# Patient Record
Sex: Female | Born: 1985 | Hispanic: Yes | Smoking: Never smoker
Health system: Southern US, Community
[De-identification: ages and names within clinical notes are randomized; demographics above are authoritative.]

## PROBLEM LIST (undated history)

## (undated) DIAGNOSIS — O139 Gestational [pregnancy-induced] hypertension without significant proteinuria, unspecified trimester: Secondary | ICD-10-CM

## (undated) DIAGNOSIS — O1490 Unspecified pre-eclampsia, unspecified trimester: Secondary | ICD-10-CM

## (undated) DIAGNOSIS — O24419 Gestational diabetes mellitus in pregnancy, unspecified control: Secondary | ICD-10-CM

## (undated) HISTORY — DX: Gestational (pregnancy-induced) hypertension without significant proteinuria, unspecified trimester: O13.9

## (undated) HISTORY — DX: Unspecified pre-eclampsia, unspecified trimester: O14.90

---
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
## 2020-04-02 LAB — OB RESULTS CONSOLE RPR: RPR: NONREACTIVE

## 2020-04-02 LAB — OB RESULTS CONSOLE HGB/HCT, BLOOD
HCT: 36 (ref 29–41)
Hemoglobin: 12.5

## 2020-04-02 LAB — HEPATITIS C ANTIBODY: HCV Ab: NEGATIVE

## 2020-04-02 LAB — OB RESULTS CONSOLE GC/CHLAMYDIA
Chlamydia: NEGATIVE
Gonorrhea: NEGATIVE

## 2020-04-02 LAB — OB RESULTS CONSOLE RUBELLA ANTIBODY, IGM: Rubella: IMMUNE

## 2020-04-02 LAB — OB RESULTS CONSOLE VARICELLA ZOSTER ANTIBODY, IGG: Varicella: IMMUNE

## 2020-04-02 LAB — SICKLE CELL SCREEN: Sickle Cell Screen: NORMAL

## 2020-04-02 LAB — OB RESULTS CONSOLE ANTIBODY SCREEN: Antibody Screen: NEGATIVE

## 2020-04-02 LAB — GLUCOSE TOLERANCE, 1 HOUR: Glucose, 1 Hour GTT: 114

## 2020-04-02 LAB — OB RESULTS CONSOLE ABO/RH: RH Type: POSITIVE

## 2020-04-02 LAB — OB RESULTS CONSOLE HIV ANTIBODY (ROUTINE TESTING): HIV: NONREACTIVE

## 2020-04-02 LAB — OB RESULTS CONSOLE PLATELET COUNT: Platelets: 374

## 2020-04-02 LAB — CYTOLOGY - PAP: Pap: NEGATIVE

## 2020-04-04 ENCOUNTER — Other Ambulatory Visit: Payer: Self-pay

## 2020-04-11 ENCOUNTER — Ambulatory Visit: Payer: Self-pay | Attending: Family Medicine

## 2020-04-25 ENCOUNTER — Encounter: Payer: Self-pay | Admitting: General Practice

## 2020-04-30 ENCOUNTER — Encounter: Payer: Self-pay | Admitting: Family Medicine

## 2020-05-08 ENCOUNTER — Encounter: Payer: Self-pay | Admitting: Family Medicine

## 2020-05-08 ENCOUNTER — Ambulatory Visit (INDEPENDENT_AMBULATORY_CARE_PROVIDER_SITE_OTHER): Payer: Self-pay | Admitting: Family Medicine

## 2020-05-08 ENCOUNTER — Other Ambulatory Visit: Payer: Self-pay

## 2020-05-08 VITALS — BP 104/69 | HR 90 | Wt 177.7 lb

## 2020-05-08 DIAGNOSIS — Z98891 History of uterine scar from previous surgery: Secondary | ICD-10-CM

## 2020-05-08 DIAGNOSIS — O09299 Supervision of pregnancy with other poor reproductive or obstetric history, unspecified trimester: Secondary | ICD-10-CM | POA: Insufficient documentation

## 2020-05-08 DIAGNOSIS — O0990 Supervision of high risk pregnancy, unspecified, unspecified trimester: Secondary | ICD-10-CM | POA: Insufficient documentation

## 2020-05-08 DIAGNOSIS — O280 Abnormal hematological finding on antenatal screening of mother: Secondary | ICD-10-CM | POA: Insufficient documentation

## 2020-05-08 DIAGNOSIS — O09899 Supervision of other high risk pregnancies, unspecified trimester: Secondary | ICD-10-CM | POA: Insufficient documentation

## 2020-05-08 LAB — COMPREHENSIVE METABOLIC PANEL
ALT: 18 IU/L (ref 0–32)
AST: 18 IU/L (ref 0–40)
Albumin/Globulin Ratio: 1.3 (ref 1.2–2.2)
Albumin: 3.9 g/dL (ref 3.8–4.8)
Alkaline Phosphatase: 81 IU/L (ref 44–121)
BUN/Creatinine Ratio: 13 (ref 9–23)
BUN: 7 mg/dL (ref 6–20)
Bilirubin Total: 0.3 mg/dL (ref 0.0–1.2)
CO2: 20 mmol/L (ref 20–29)
Calcium: 9.7 mg/dL (ref 8.7–10.2)
Chloride: 103 mmol/L (ref 96–106)
Creatinine, Ser: 0.55 mg/dL — ABNORMAL LOW (ref 0.57–1.00)
GFR calc Af Amer: 141 mL/min/{1.73_m2} (ref >59)
GFR calc non Af Amer: 122 mL/min/{1.73_m2} (ref >59)
Globulin, Total: 3 g/dL (ref 1.5–4.5)
Glucose: 111 mg/dL — ABNORMAL HIGH (ref 65–99)
Potassium: 4.2 mmol/L (ref 3.5–5.2)
Sodium: 138 mmol/L (ref 134–144)
Total Protein: 6.9 g/dL (ref 6.0–8.5)

## 2020-05-08 NOTE — Patient Instructions (Signed)
 Eleccin del mtodo anticonceptivo Contraception Choices La anticoncepcin, o los mtodos anticonceptivos, hace referencia a los mtodos o dispositivos que evitan el embarazo. Mtodos hormonales Implante anticonceptivo Un implante anticonceptivo consiste en un tubo delgado de plstico que contiene una hormona que evita el embarazo. Es diferente de un dispositivo intrauterino (DIU). Un mdico lo inserta en la parte superior del brazo. Los implantes pueden ser eficaces durante un mximo de 3 aos. Inyecciones de progestina sola Las inyecciones de progestina sola contienen progestina, una forma sinttica de la hormona progesterona. Un mdico las administra cada 3 meses. Pldoras anticonceptivas Las pldoras anticonceptivas son pastillas que contienen hormonas que evitan el embarazo. Deben tomarse una vez al da, preferentemente a la misma hora cada da. Se necesita una receta para utilizar este mtodo anticonceptivo. Parche anticonceptivo El parche anticonceptivo contiene hormonas que evitan el embarazo. Se coloca en la piel, debe cambiarse una vez a la semana durante tres semanas y debe retirarse en la cuarta semana. Se necesita una receta para utilizar este mtodo anticonceptivo. Anillo vaginal Un anillo vaginal contiene hormonas que evitan el embarazo. Se coloca en la vagina durante tres semanas y se retira en la cuarta semana. Luego se repite el proceso con un anillo nuevo. Se necesita una receta para utilizar este mtodo anticonceptivo. Anticonceptivo de emergencia Los anticonceptivos de emergencia son mtodos para evitar un embarazo despus de tener sexo sin proteccin. Vienen en forma de pldora y pueden tomarse hasta 5 das despus de tener sexo. Funcionan mejor cuando se toman lo ms pronto posible luego de tener sexo. La mayora de los anticonceptivos de emergencia estn disponibles sin receta mdica. Este mtodo no debe utilizarse como el nico mtodo anticonceptivo.   Mtodos de  barrera Condn masculino Un condn masculino es una vaina delgada que se coloca sobre el pene durante el sexo. Los condones evitan que el esperma ingrese en el cuerpo de la mujer. Pueden utilizarse con un una sustancia que mata a los espermatozoides (espermicida) para aumentar la efectividad. Deben desecharse despus de un uso. Condn femenino Un condn femenino es una vaina blanda y holgada que se coloca en la vagina antes de tener sexo. El condn evita que el esperma ingrese en el cuerpo de la mujer. Deben desecharse despus de un uso. Diafragma Un diafragma es una barrera blanda con forma de cpula. Se inserta en la vagina antes del sexo, junto con un espermicida. El diafragma bloquea el ingreso de esperma en el tero, y el espermicida mata a los espermatozoides. El diafragma debe permanecer en la vagina durante 6 a 8 horas despus de tener sexo y debe retirarse en el plazo de las 24 horas. Un diafragma es recetado y colocado por un mdico. Debe reemplazarse cada 1 a 2 aos, despus de dar a luz, de aumentar ms de 15lb (6.8kg) y de una ciruga plvica. Capuchn cervical Un capuchn cervical es una copa redonda y blanda de ltex o plstico que se coloca en el cuello uterino. Se inserta en la vagina antes del sexo, junto con un espermicida. Bloquea el ingreso del esperma en el tero. El capuchn debe permanecer en el lugar durante 6 a 8 horas despus de tener sexo y debe retirarse en el plazo de las 48 horas. Un capuchn cervical debe ser recetado y colocado por un mdico. Debe reemplazarse cada 2aos. Esponja Una esponja es una pieza blanda y circular de espuma de poliuretano que contiene espermicida. La esponja ayuda a bloquear el ingreso de esperma en el tero, y el   espermicida mata a los espermatozoides. Para utilizarla, debe humedecerla e insertarla en la vagina. Debe insertarse antes de tener sexo, debe permanecer dentro al menos durante 6 horas despus de tener sexo y debe retirarse y  desecharse en el plazo de las 30 horas. Espermicidas Los espermicidas son sustancias qumicas que matan o bloquean al esperma y no lo dejan ingresar al cuello uterino y al tero. Vienen en forma de crema, gel, supositorio, espuma o comprimido. Un espermicida debe insertarse en la vagina con un aplicador al menos 10 o 15 minutos antes de tener sexo para dar tiempo a que surta efecto. El proceso debe repetirse cada vez que tenga sexo. Los espermicidas no requieren receta mdica.   Anticonceptivos intrauterinos Dispositivo intrauterino (DIU) Un DIU es un dispositivo en forma de T que se coloca en el tero. Existen dos tipos:  DIU hormonal.Este tipo contiene progestina, una forma sinttica de la hormona progesterona. Este tipo puede permanecer colocado durante 3 a 5 aos.  DIU de cobre.Este tipo est recubierto con un alambre de cobre. Puede permanecer colocado durante 10 aos. Mtodos anticonceptivos permanentes Ligadura de trompas en la mujer En este mtodo, se sellan, atan u obstruyen las trompas de Falopio durante una ciruga para evitar que el vulo descienda hacia el tero. Esterilizacin histeroscpica En este mtodo, se coloca un implante pequeo y flexible dentro de cada trompa de Falopio. Los implantes hacen que se forme un tejido cicatricial en las trompas de Falopio y que las obstruya para que el espermatozoide no pueda llegar al vulo. El procedimiento demora alrededor de 3 meses para que sea efectivo. Debe utilizarse otro mtodo anticonceptivo durante esos 3 meses. Esterilizacin masculina Este es un procedimiento que consiste en atar los conductos que transportan el esperma (vasectoma). Luego del procedimiento, el hombre puede eyacular lquido (semen). Debe utilizarse otro mtodo anticonceptivo durante 3 meses despus del procedimiento. Mtodos de planificacin natural Planificacin familiar natural En este mtodo, la pareja no tiene sexo durante los das en que la mujer podra quedar  embarazada. Mtodo calendario En este mtodo, la mujer realiza un seguimiento de la duracin de cada ciclo menstrual, identifica los das en los que se puede producir un embarazo y no tiene sexo durante esos das. Mtodo de la ovulacin En este mtodo, la pareja evita tener sexo durante la ovulacin. Mtodo sintotrmico Este mtodo implica no tener sexo durante la ovulacin. Normalmente, la mujer comprueba la ovulacin al observar cambios en su temperatura y en la consistencia del moco cervical. Mtodo posovulacin En este mtodo, la pareja espera a que finalice la ovulacin para tener sexo. Dnde buscar ms informacin  Centers for Disease Control and Prevention (Centros para el Control y la Prevencin de Enfermedades): www.cdc.gov Resumen  La anticoncepcin, o los mtodos anticonceptivos, hace referencia a los mtodos o dispositivos que evitan el embarazo.  Los mtodos anticonceptivos hormonales incluyen implantes, inyecciones, pastillas, parches, anillos vaginales y anticonceptivos de emergencia.  Los mtodos anticonceptivos de barrera pueden incluir condones masculinos, condones femeninos, diafragmas, capuchones cervicales, esponjas y espermicidas.  Existen dos tipos de DIU (dispositivo intrauterino). Un DIU puede colocarse en el tero de una mujer para evitar el embarazo durante 3 a 5 aos.  La esterilizacin permanente puede realizarse mediante un procedimiento tanto en los hombres como en las mujeres. Los mtodos de planificacin familiar natural implican no tener sexo durante los das en que la mujer podra quedar embarazada. Esta informacin no tiene como fin reemplazar el consejo del mdico. Asegrese de hacerle al mdico cualquier pregunta que   tenga. Document Revised: 11/01/2019 Document Reviewed: 11/01/2019 Elsevier Patient Education  2021 [employer] Inc.   Lactancia materna Breastfeeding  Decidir amamantar es una de las mejores elecciones que puede hacer por usted y su  beb. Un cambio en las hormonas durante el embarazo hace que las mamas produzcan leche materna en las glndulas productoras de leche. Las hormonas impiden que la leche materna sea liberada antes del nacimiento del beb. Adems, impulsan el flujo de leche luego del nacimiento. Una vez que ha comenzado a amamantar, pensar en el beb, as como la succin o el llanto, pueden estimular la liberacin de leche de las glndulas productoras de leche. Los beneficios de amamantar Las investigaciones demuestran que la lactancia materna ofrece muchos beneficios de salud para bebs y madres. Adems, ofrece una forma gratuita y conveniente de alimentar al beb. Para el beb  La primera leche (calostro) ayuda a mejorar el funcionamiento del aparato digestivo del beb.  Las clulas especiales de la leche (anticuerpos) ayudan a combatir las infecciones en el beb.  Los bebs que se alimentan con leche materna tambin tienen menos probabilidades de tener asma, alergias, obesidad o diabetes de tipo 2. Adems, tienen menor riesgo de sufrir el sndrome de muerte sbita del lactante (SMSL).  Los nutrientes de la leche materna son mejores para satisfacer las necesidades del beb en comparacin con la leche maternizada.  La leche materna mejora el desarrollo cerebral del beb. Para usted  La lactancia materna favorece el desarrollo de un vnculo muy especial entre la madre y el beb.  Es conveniente. La leche materna es econmica y siempre est disponible a la temperatura correcta.  La lactancia materna ayuda a quemar caloras. Le ayuda a perder el peso ganado durante el embarazo.  Hace que el tero vuelva al tamao que tena antes del embarazo ms rpido. Adems, disminuye el sangrado (loquios) despus del parto.  La lactancia materna contribuye a reducir el riesgo de tener diabetes de tipo 2, osteoporosis, artritis reumatoide, enfermedades cardiovasculares y cncer de mama, ovario, tero y endometrio en el  futuro. Informacin bsica sobre la lactancia Comienzo de la lactancia  Encuentre un lugar cmodo para sentarse o acostarse, con un buen respaldo para el cuello y la espalda.  Coloque una almohada o una manta enrollada debajo del beb para acomodarlo a la altura de la mama (si est sentada). Las almohadas para amamantar se han diseado especialmente a fin de servir de apoyo para los brazos y el beb mientras amamanta.  Asegrese de que la barriga del beb (abdomen) est frente a la suya.  Masajee suavemente la mama. Con las yemas de los dedos, masajee los bordes exteriores de la mama hacia adentro, en direccin al pezn. Esto estimula el flujo de leche. Si la leche fluye lentamente, es posible que deba continuar con este movimiento durante la lactancia.  Sostenga la mama con 4 dedos por debajo y el pulgar por arriba del pezn (forme la letra "C" con la mano). Asegrese de que los dedos se encuentren lejos del pezn y de la boca del beb.  Empuje suavemente los labios del beb con el pezn o con el dedo.  Cuando la boca del beb se abra lo suficiente, acrquelo rpidamente a la mama e introduzca todo el pezn y la arola, tanto como sea posible, dentro de la boca del beb. La arola es la zona de color que rodea al pezn. ? Debe haber ms arola visible por arriba del labio superior del beb que por debajo del labio   inferior. ? Los labios del beb deben estar abiertos y extendidos hacia afuera (evertidos) para asegurar que el beb se prenda de forma adecuada y cmoda. ? La lengua del beb debe estar entre la enca inferior y la mama.  Asegrese de que la boca del beb est en la posicin correcta alrededor del pezn (prendido). Los labios del beb deben crear un sello sobre la mama y estar doblados hacia afuera (invertidos).  Es comn que el beb succione durante 2 a 3 minutos para que comience el flujo de leche materna. Cmo debe prenderse Es muy importante que le ensee al beb cmo  prenderse adecuadamente a la mama. Si el beb no se prende adecuadamente, puede causar dolor en los pezones, reducir la produccin de leche materna y hacer que el beb tenga un escaso aumento de peso. Adems, si el beb no se prende adecuadamente al pezn, puede tragar aire durante la alimentacin. Esto puede causarle molestias al beb. Hacer eructar al beb al cambiar de mama puede ayudarlo a liberar el aire. Sin embargo, ensearle al beb cmo prenderse a la mama adecuadamente es la mejor manera de evitar que se sienta molesto por tragar aire mientras se alimenta. Signos de que el beb se ha prendido adecuadamente al pezn  Tironea o succiona de modo silencioso, sin causarle dolor. Los labios del beb deben estar extendidos hacia afuera (evertidos).  Se escucha que traga cada 3 o 4 succiones una vez que la leche ha comenzado a fluir (despus de que se produzca el reflejo de eyeccin de la leche).  Hay movimientos musculares por arriba y por delante de sus odos al succionar. Signos de que el beb no se ha prendido adecuadamente al pezn  Hace ruidos de succin o de chasquido mientras se alimenta.  Siente dolor en los pezones. Si cree que el beb no se prendi correctamente, deslice el dedo en la comisura de la boca y colquelo entre las encas del beb para interrumpir la succin. Intente volver a comenzar a amamantar. Signos de lactancia materna exitosa Signos del beb  El beb disminuir gradualmente el nmero de succiones o dejar de succionar por completo.  El beb se quedar dormido.  El cuerpo del beb se relajar.  El beb retendr una pequea cantidad de leche en la boca.  El beb se desprender solo del pecho. Signos que presenta usted  Las mamas han aumentado la firmeza, el peso y el tamao 1 a 3 horas despus de amamantar.  Estn ms blandas inmediatamente despus de amamantar.  Se producen un aumento del volumen de leche y un cambio en su consistencia y color hacia el  quinto da de lactancia.  Los pezones no duelen, no estn agrietados ni sangran. Signos de que su beb recibe la cantidad de leche suficiente  Mojar por lo menos 1 o 2paales durante las primeras 24horas despus del nacimiento.  Mojar por lo menos 5 o 6paales cada 24horas durante la primera semana despus del nacimiento. La orina debe ser clara o de color amarillo plido a los 5das de vida.  Mojar entre 6 y 8paales cada 24horas a medida que el beb sigue creciendo y desarrollndose.  Defeca por lo menos 3 veces en 24 horas a los 5 das de vida. Las heces deben ser blandas y amarillentas.  Defeca por lo menos 3 veces en 24 horas a los 7 das de vida. Las heces deben ser grumosas y amarillentas.  No registra una prdida de peso mayor al 10% del peso al   nacer durante los primeros 3 das de vida.  Aumenta de peso un promedio de 4 a 7onzas (113 a 198g) por semana despus de los 4 das de vida.  Aumenta de peso, diariamente, de manera uniforme a partir de los 5 das de vida, sin registrar prdida de peso despus de las 2semanas de vida. Despus de alimentarse, es posible que el beb regurgite una pequea cantidad de leche. Esto es normal. Frecuencia y duracin de la lactancia El amamantamiento frecuente la ayudar a producir ms leche y puede prevenir dolores en los pezones y las mamas extremadamente llenas (congestin mamaria). Alimente al beb cuando muestre signos de hambre o si siente la necesidad de reducir la congestin de las mamas. Esto se denomina "lactancia a demanda". Las seales de que el beb tiene hambre incluyen las siguientes:  Aumento del estado de alerta, actividad o inquietud.  Mueve la cabeza de un lado a otro.  Abre la boca cuando se le toca la mejilla o la comisura de la boca (reflejo de bsqueda).  Aumenta las vocalizaciones, tales como sonidos de succin, se relame los labios, emite arrullos, suspiros o chirridos.  Mueve la mano hacia la boca y se chupa  los dedos o las manos.  Est molesto o llora. Evite el uso del chupete en las primeras 4 a 6 semanas despus del nacimiento del beb. Despus de este perodo, podr usar un chupete. Las investigaciones demostraron que el uso del chupete durante el primer ao de vida del beb disminuye el riesgo de tener el sndrome de muerte sbita del lactante (SMSL). Permita que el nio se alimente en cada mama todo lo que desee. Cuando el beb se desprende o se queda dormido mientras se est alimentando de la primera mama, ofrzcale la segunda. Debido a que, con frecuencia, los recin nacidos estn somnolientos las primeras semanas de vida, es posible que deba despertar al beb para alimentarlo. Los horarios de lactancia varan de un beb a otro. Sin embargo, las siguientes reglas pueden servir como gua para ayudarla a garantizar que el beb se alimenta adecuadamente:  Se puede amamantar a los recin nacidos (bebs de 4 semanas o menos de vida) cada 1 a 3 horas.  No deben transcurrir ms de 3 horas durante el da o 5 horas durante la noche sin que se amamante a los recin nacidos.  Debe amamantar al beb un mnimo de 8 veces en un perodo de 24 horas. Extraccin de leche materna La extraccin y el almacenamiento de la leche materna le permiten asegurarse de que el beb se alimente exclusivamente de su leche materna, aun en momentos en los que no puede amamantar. Esto tiene especial importancia si debe regresar al trabajo en el perodo en que an est amamantando o si no puede estar presente en los momentos en que el beb debe alimentarse. Su asesor en lactancia puede ayudarla a encontrar un mtodo de extraccin que funcione mejor para usted y orientarla sobre cunto tiempo es seguro almacenar leche materna.      Cmo cuidar las mamas durante la lactancia Los pezones pueden secarse, agrietarse y doler durante la lactancia. Las siguientes recomendaciones pueden ayudarla a mantener las mamas humectadas y  sanas:  Evite usar jabn en los pezones.  Use un sostn de soporte diseado especialmente para la lactancia materna. Evite usar sostenes con aro o sostenes muy ajustados (sostenes deportivos).  Seque al aire sus pezones durante 3 a 4minutos despus de amamantar al beb.  Utilice solo apsitos de algodn en   el sostn para absorber las prdidas de leche. La prdida de un poco de leche materna entre las tomas es normal.  Utilice lanolina sobre los pezones luego de amamantar. La lanolina ayuda a mantener la humedad normal de la piel. La lanolina pura no es perjudicial (no es txica) para el beb. Adems, puede extraer manualmente algunas gotas de leche materna y masajear suavemente esa leche sobre los pezones para que la leche se seque al aire. Durante las primeras semanas despus del nacimiento, algunas mujeres experimentan congestin mamaria. La congestin mamaria puede hacer que sienta las mamas pesadas, calientes y sensibles al tacto. El pico de la congestin mamaria ocurre en el plazo de los 3 a 5 das despus del parto. Las siguientes recomendaciones pueden ayudarla a aliviar la congestin mamaria:  Vace por completo las mamas al amamantar o extraer leche. Puede aplicar calor hmedo en las mamas (en la ducha o con toallas hmedas para manos) antes de amamantar o extraer leche. Esto aumenta la circulacin y ayuda a que la leche fluya. Si el beb no vaca por completo las mamas cuando lo amamanta, extraiga la leche restante despus de que haya finalizado.  Aplique compresas de hielo sobre las mamas inmediatamente despus de amamantar o extraer leche, a menos que le resulte demasiado incmodo. Haga lo siguiente: ? Ponga el hielo en una bolsa plstica. ? Coloque una toalla entre la piel y la bolsa de hielo. ? Coloque el hielo durante 20minutos, 2 o 3veces por da.  Asegrese de que el beb est prendido y se encuentre en la posicin correcta mientras lo alimenta. Si la congestin mamaria  persiste luego de 48 horas o despus de seguir estas recomendaciones, comunquese con su mdico o un asesor en lactancia. Recomendaciones de salud general durante la lactancia  Consuma 3 comidas y 3 colaciones saludables todos los das. Las madres bien alimentadas que amamantan necesitan entre 450 y 500 caloras adicionales por da. Puede cumplir con este requisito al aumentar la cantidad de una dieta equilibrada que realice.  Beba suficiente agua para mantener la orina clara o de color amarillo plido.  Descanse con frecuencia, reljese y siga tomando sus vitaminas prenatales para prevenir la fatiga, el estrs y los niveles bajos de vitaminas y minerales en el cuerpo (deficiencias de nutrientes).  No consuma ningn producto que contenga nicotina o tabaco, como cigarrillos y cigarrillos electrnicos. El beb puede verse afectado por las sustancias qumicas de los cigarrillos que pasan a la leche materna y por la exposicin al humo ambiental del tabaco. Si necesita ayuda para dejar de fumar, consulte al mdico.  Evite el consumo de alcohol.  No consuma drogas ilegales o marihuana.  Antes de usar cualquier medicamento, hable con el mdico. Estos incluyen medicamentos recetados y de venta libre, como tambin vitaminas y suplementos a base de hierbas. Algunos medicamentos, que pueden ser perjudiciales para el beb, pueden pasar a travs de la leche materna.  Puede quedar embarazada durante la lactancia. Si se desea un mtodo anticonceptivo, consulte al mdico sobre cules son las opciones seguras durante la lactancia. Dnde encontrar ms informacin: Liga internacional La Leche: www.llli.org. Comunquese con un mdico si:  Siente que quiere dejar de amamantar o se siente frustrada con la lactancia.  Sus pezones estn agrietados o sangran.  Sus mamas estn irritadas, sensibles o calientes.  Tiene los siguientes sntomas: ? Dolor en las mamas o en los pezones. ? Un rea hinchada en cualquiera  de las mamas. ? Fiebre o escalofros. ? Nuseas o vmitos. ?   Drenaje de otro lquido distinto de la leche materna desde los pezones.  Sus mamas no se llenan antes de amamantar al beb para el quinto da despus del parto.  Se siente triste y deprimida.  El beb: ? Est demasiado somnoliento como para comer bien. ? Tiene problemas para dormir. ? Tiene ms de 1 semana de vida y moja menos de 6 paales en un periodo de 24 horas. ? No ha aumentado de peso a los 5 das de vida.  El beb defeca menos de 3 veces en 24 horas.  La piel del beb o las partes blancas de los ojos se vuelven amarillentas. Solicite ayuda de inmediato si:  El beb est muy cansado (letargo) y no se quiere despertar para comer.  Le sube la fiebre sin causa. Resumen  La lactancia materna ofrece muchos beneficios de salud para bebs y madres.  Intente amamantar a su beb cuando muestre signos tempranos de hambre.  Haga cosquillas o empuje suavemente los labios del beb con el dedo o el pezn para lograr que el beb abra la boca. Acerque el beb a la mama. Asegrese de que la mayor parte de la arola se encuentre dentro de la boca del beb. Ofrzcale una mama y haga eructar al beb antes de pasar a la otra.  Hable con su mdico o asesor en lactancia si tiene dudas o problemas con la lactancia. Esta informacin no tiene como fin reemplazar el consejo del mdico. Asegrese de hacerle al mdico cualquier pregunta que tenga. Document Revised: 06/25/2017 Document Reviewed: 07/21/2016 Elsevier Patient Education  2021 [employer] Inc.  

## 2020-05-08 NOTE — Progress Notes (Signed)
Subjective:   Kelli Robbins is a 35 y.o. [id number] at [redacted]w[redacted]d by LMP (reports she had regular period prior to conceiving) being seen today for her first obstetrical visit.  Her obstetrical history is significant for advanced maternal age, pre-eclampsia and preterm delivery. Patient does intend to breast feed. Pregnancy history fully reviewed.   Patient reports no complaints.  Patient reports first delivery at 28 weeks in setting of preterm labor. Unsure why they did a section, does not know if baby was breech. Second was repeat for history of prior section and also had pre-eclampsia, done at term. Does not know why she was diagnosed with PreE.   HISTORY: OB History  Gravida Para Term Preterm AB Living  3 2 1 1  0 2  SAB IAB Ectopic Multiple Live Births  0 0 0 0 2    # Outcome Date GA Lbr Len/2nd Weight Sex Delivery Anes PTL Lv  3 Current           2 Term 12/16/10 [redacted]w[redacted]d  8 lb (3.629 kg) F CS-LTranv   LIV  1 Preterm 07/30/01 [redacted]w[redacted]d  4 lb 8 oz (2.041 kg) M CS-LTranv   LIV     Complications: IUGR (intrauterine growth restriction) affecting care of mother   Last pap smear was  04/02/2020 and was normal with negative HRHPV Past Medical History:  Diagnosis Date  . Gestational hypertension   . Preeclampsia    Past Surgical History:  Procedure Laterality Date  . CESAREAN SECTION     Family History  Problem Relation Age of Onset  . Hypertension Mother   . Asthma Father      Not on File Current Outpatient Medications on File Prior to Visit  Medication Sig Dispense Refill  . ASPIRIN 81 PO Take by mouth.    . Prenatal Vit-Fe Fumarate-FA (PRENATAL VITAMIN PO) Take by mouth.     No current facility-administered medications on file prior to visit.     Exam   Vitals:   05/08/20 1015  BP: 104/69  Pulse: 90  Weight: 177 lb 11.2 oz (80.6 kg)   Fetal Heart Rate (bpm): 146  Uterus:     System: General: well-developed, well-nourished female in no acute distress   Skin: normal  coloration and turgor, no rashes   Neurologic: oriented, normal, negative, normal mood   Extremities: normal strength, tone, and muscle mass, ROM of all joints is normal   HEENT PERRLA, extraocular movement intact and sclera clear, anicteric   Mouth/Teeth mucous membranes moist, pharynx normal without lesions and dental hygiene good   Neck supple and no masses   Respiratory:  no respiratory distress     Assessment:   Pregnancy: 05/10/20 Patient Active Problem List   Diagnosis Date Noted  . Supervision of high risk pregnancy, antepartum 05/08/2020  . History of preterm delivery, currently pregnant 05/08/2020  . History of pre-eclampsia in prior pregnancy, currently pregnant 05/08/2020  . History of cesarean section 05/08/2020     Plan:  1. Supervision of high risk pregnancy, antepartum Transfer from [facility] due to hx of preterm delivery, pre-eclampsia, and two prior sections On review of prenatal records noted to have abnormal quad screen with elevated risk for Down Syndrome Dated by LMP currently, reports periods were regular prior to conceiving Patient very distraught after I discussed this information with her as she was unaware of these results.  Emphasized that this is a screening test and very dependent on her dates for interpretation, so until  we get back the results of the US done at [facility] five days ago we should view this result with caution If results remain abnormal/dates are confirmed discussed she would need additional testing and consultation Will follow up with her ASAP once results are available Remainder of prenatal labs reviewed and are normal  2. History of preterm delivery, currently pregnant Unclear circumstances, followed by term delivery per patient Some degree of iatrogenic element per history and already past [redacted] wks gestational age, did not discuss makena with patient  3. History of pre-eclampsia in prior pregnancy, currently pregnant No baseline CMP or  UPCR on file, will obtain today  4. History of cesarean section X2, first at 28 weeks and second at term Patient had low transverse skin incision for first cesarean Despite early gestational age likely has low transverse uterine scar and will plan on delivery at 39 weeks   Problem list reviewed and updated. The nature of [facility] with multiple MDs and other Advanced Practice Providers was explained to patient; also emphasized that residents, students are part of our team. Routine obstetric precautions reviewed. Return in 4 weeks (on 06/05/2020) for [facility], ob visit.

## 2020-05-09 LAB — PROTEIN / CREATININE RATIO, URINE
Creatinine, Urine: 78.2 mg/dL
Protein, Ur: 9.7 mg/dL
Protein/Creat Ratio: 124 mg/g creat (ref 0–200)

## 2020-05-11 ENCOUNTER — Telehealth: Payer: Self-pay | Admitting: Family Medicine

## 2020-05-11 ENCOUNTER — Encounter: Payer: Self-pay | Admitting: Family Medicine

## 2020-05-11 DIAGNOSIS — O365990 Maternal care for other known or suspected poor fetal growth, unspecified trimester, not applicable or unspecified: Secondary | ICD-10-CM | POA: Insufficient documentation

## 2020-05-11 DIAGNOSIS — O365920 Maternal care for other known or suspected poor fetal growth, second trimester, not applicable or unspecified: Secondary | ICD-10-CM

## 2020-05-11 DIAGNOSIS — O280 Abnormal hematological finding on antenatal screening of mother: Secondary | ICD-10-CM

## 2020-05-11 NOTE — Telephone Encounter (Signed)
Able to reach patient and confirm identity by phone.  Discussed that [place] results c/w dates and new finding of IUGR, need for increased monitoring, as well as upcoming appointment for MFM anatomy scan and consultation this coming Monday. Again answered many questions regarding quad screen, significance, and need for follow up testing.  All questions answered, she plans to come to clinic on Monday for further consultation.

## 2020-05-11 NOTE — Telephone Encounter (Signed)
Attempted to reach patient to discuss abnormal quad screen and results of [facility] [place], no answer x2, will try again later today.  Most recent US shows measurements consistent with dates, unfortunately also shows new finding of IUGR 4%. Patient scheduled for MFM anatomy scan and MFM consultation for this coming Monday 05/14/2020 so that further testing such as amniocentesis can be discussed.   I will try to call patient again later, if she calls the clinic please have clinic staff notify me by my personal cell phone and I will try to call her back.

## 2020-05-14 ENCOUNTER — Other Ambulatory Visit: Payer: Self-pay

## 2020-05-14 ENCOUNTER — Ambulatory Visit: Payer: Self-pay | Admitting: *Deleted

## 2020-05-14 ENCOUNTER — Ambulatory Visit: Payer: Self-pay | Attending: Family Medicine

## 2020-05-14 DIAGNOSIS — O99012 Anemia complicating pregnancy, second trimester: Secondary | ICD-10-CM

## 2020-05-14 DIAGNOSIS — E669 Obesity, unspecified: Secondary | ICD-10-CM

## 2020-05-14 DIAGNOSIS — Z363 Encounter for antenatal screening for malformations: Secondary | ICD-10-CM

## 2020-05-14 DIAGNOSIS — Z3A22 22 weeks gestation of pregnancy: Secondary | ICD-10-CM

## 2020-05-14 DIAGNOSIS — O09292 Supervision of pregnancy with other poor reproductive or obstetric history, second trimester: Secondary | ICD-10-CM

## 2020-05-14 DIAGNOSIS — O09899 Supervision of other high risk pregnancies, unspecified trimester: Secondary | ICD-10-CM | POA: Insufficient documentation

## 2020-05-14 DIAGNOSIS — O09522 Supervision of elderly multigravida, second trimester: Secondary | ICD-10-CM

## 2020-05-14 DIAGNOSIS — O09299 Supervision of pregnancy with other poor reproductive or obstetric history, unspecified trimester: Secondary | ICD-10-CM | POA: Insufficient documentation

## 2020-05-14 DIAGNOSIS — O365920 Maternal care for other known or suspected poor fetal growth, second trimester, not applicable or unspecified: Secondary | ICD-10-CM | POA: Insufficient documentation

## 2020-05-14 DIAGNOSIS — O99212 Obesity complicating pregnancy, second trimester: Secondary | ICD-10-CM

## 2020-05-14 DIAGNOSIS — O09212 Supervision of pregnancy with history of pre-term labor, second trimester: Secondary | ICD-10-CM

## 2020-05-14 DIAGNOSIS — Z8759 Personal history of other complications of pregnancy, childbirth and the puerperium: Secondary | ICD-10-CM

## 2020-05-14 DIAGNOSIS — O0990 Supervision of high risk pregnancy, unspecified, unspecified trimester: Secondary | ICD-10-CM | POA: Insufficient documentation

## 2020-05-14 DIAGNOSIS — O34219 Maternal care for unspecified type scar from previous cesarean delivery: Secondary | ICD-10-CM

## 2020-05-14 DIAGNOSIS — D649 Anemia, unspecified: Secondary | ICD-10-CM

## 2020-05-15 ENCOUNTER — Other Ambulatory Visit: Payer: Self-pay | Admitting: *Deleted

## 2020-05-15 DIAGNOSIS — O09522 Supervision of elderly multigravida, second trimester: Secondary | ICD-10-CM

## 2020-05-17 ENCOUNTER — Ambulatory Visit: Payer: Self-pay | Admitting: Genetic Counselor

## 2020-05-17 ENCOUNTER — Other Ambulatory Visit: Payer: Self-pay

## 2020-05-17 ENCOUNTER — Ambulatory Visit: Payer: Self-pay | Attending: Obstetrics and Gynecology | Admitting: Genetic Counselor

## 2020-05-17 DIAGNOSIS — O281 Abnormal biochemical finding on antenatal screening of mother: Secondary | ICD-10-CM

## 2020-05-17 DIAGNOSIS — Z315 Encounter for genetic counseling: Secondary | ICD-10-CM

## 2020-05-17 DIAGNOSIS — O280 Abnormal hematological finding on antenatal screening of mother: Secondary | ICD-10-CM

## 2020-05-17 NOTE — Progress Notes (Signed)
05/17/2020  Kelli Robbins 28-May-1985 MRN: [id number] DOV: 05/17/2020  [name] presented to the [facility] for a genetics consultation regarding quad screening results that were high risk for trisomy 58. [name] presented to her appointment alone. This session was facilitated by a [facility] Spanish interpreter.    Indication for genetic counseling - Quad screen high risk for trisomy 44  Prenatal history  [name] is a [id number], 34 y.o. female. Her current pregnancy has completed [redacted]w[redacted]d (Estimated Date of Delivery: 09/17/20). [name] and her partner have a 39 year old son and a 12 year old daughter together.   [name] denied exposure to environmental toxins or chemical agents. She denied the use of alcohol, tobacco or street drugs. She denied significant viral illnesses, fevers, and bleeding during the course of her pregnancy. Her medical and surgical histories were noncontributory.  Family History  A three generation pedigree was drafted and reviewed. Both family histories were reviewed and found to be noncontributory for birth defects, intellectual disability, recurrent pregnancy loss, and known genetic conditions.    The patient's ancestry is [place]. The father of the pregnancy's ancestry is [place]. Ashkenazi Jewish ancestry and consanguinity were denied. Pedigree will be scanned under Media.  Discussion  Quad screening result:  [name] was referred for genetic counseling as results from her quad screening came back positive for an increased risk of trisomy 87, aka Down syndrome, in the current pregnancy. Based on results from quad screening, the risk for Down syndrome in the current pregnancy increased from [name] 1 in 321 (0.3%) age-related risk to 1 in 76 (1.3%). Additionally, the risk for trisomy 18 in the current pregnancy was decreased to 1 in 965, and there was a low risk for  open neural tube defects (ONTDs) in the pregnancy.   We discussed that Down syndrome is one of the most common extra chromosome conditions, as approximately 1 in 800 babies are born with this condition. It is most often caused by an entire extra copy of chromosome 21 (trisomy 21). We reviewed that Down syndrome most commonly occurs by chance due to an error in chromosomal division during the formation of egg and sperm cells in a process called nondisjunction.    Down syndrome is characterized by a distinctive facial appearance, mild to moderate intellectual disability, and an increased chance for a heart defect. Approximately half of babies with Down syndrome are born with a heart defect that may require surgery after birth. While many children with Down syndrome look similar to each other, each child with Down syndrome is unique and will have many more features in common with his or her own family members. Children with Down syndrome also have an increased chance for thyroid problems, which can range from an underactive to an overactive thyroid. Additionally, low muscle tone, gastrointestinal abnormalities, vision problems, and respiratory and ear infections are more common among babies with Down syndrome. We discussed that there are many more features that can be associated with Down syndrome; however, it is not possible to accurately predict all features that would be present in an individual with Down syndrome prenatally. Additionally, there is a high degree of variability seen among children who have this condition, meaning that every child with Down syndrome will not be affected in exactly the same way, and some children will have more or less features than others. It is not possible to  predict what strengths and weaknesses a child with Down syndrome will have, just like it is not possible to predict this for any child.   We discussed that quad screening measures levels of hormones that are made by the  body during pregnancy. Patterns of these hormones can be associated with chromosomal aneuploidies such as Down syndrome. [name] quad screening was high risk for Down syndrome due to elevated human chorionic gonadotropin (hCG) and inhibin (DIA) levels (1.94 MoM and 1.46 MoM respectively) and decreased alpha-fetoprotein (AFP) and unconjugated estriol (uE3) levels (0.61 MoM and 0.77 MoM respectively).  Ultrasound:  A complete ultrasound was performed earlier this week. The ultrasound report was sent under separate cover. There were no visualized fetal anomalies or markers suggestive of aneuploidy. [name] was counseled that ~50% of fetuses with Down syndrome demonstrate a sign of the condition on anatomy ultrasound.  Other testing options:  We reviewed noninvasive prenatal screening (NIPS) as another available aneuploidy screening option to further refine the chance for Down syndrome in the pregnancy. Specifically, we discussed that NIPS analyzes cell free DNA originating from the placenta that is found in the maternal blood circulation during pregnancy instead of hormone levels. NIPS is not diagnostic for chromosome conditions, but can provide information regarding the presence or absence of extra fetal DNA for chromosomes 13, 18, 21, and the sex chromosomes. Thus, it would not identify or rule out all fetal aneuploidy. The reported detection rate is 91-99% for trisomies 21, 18, 13, and sex chromosome aneuploidies, which is higher than the detection rate associated with quad screening. The false positive rate is reported to be less than 0.1% for any of the conditions on NIPS, which is lower than the false positive rate associated with quad screening.    [name] was also counseled regarding diagnostic testing via amniocentesis. We discussed the technical aspects of the procedure and quoted up to a 1 in 500 (0.2%) risk for spontaneous pregnancy loss or other adverse pregnancy  outcomes as a result of amniocentesis. Cultured cells from an amniocentesis sample allow for the visualization of a fetal karyotype, which can detect >99% of large chromosomal aberrations, including trisomy 21. Chromosomal microarray can also be performed to identify smaller deletions or duplications of fetal chromosomal material. [name] was informed that amniocentesis is the only to definitively determine if a fetus has Down syndrome prenatally. She was also informed that she has the option of waiting to pursue postnatal testing if Down syndrome is suspected after birth.  After careful consideration, [name] declined NIPS and amniocentesis at this time. She understands that these options remain available throughout the remainder of her pregnancy and that she may opt to undergo additional testing at a later date should she change her mind.  Plan:  Additional screening and diagnostic testing were declined today. [name] referenced her strong faith in God and her prayers and beliefs that everything will be okay, even if her baby has Down syndrome. She understands that screening tests, including ultrasound, cannot rule out all birth defects or genetic syndromes. She would prefer to pursue postnatal testing if indicated.  At the end of the session, [name] was provided with a flyer about a research study from a student in [employer] examining Spanish-speaking patients' experience with an interpreter during genetic counseling.  I counseled [name] regarding the above risks and available options. The approximate face-to-face time with the genetic counselor was 40  minutes.  In summary:  Discussed quad screening result  1 in 76 (1.3%) chance for Down syndrome based on quad screening result  Low risk for trisomy 18 or open neural tube defect  Reviewed results of ultrasound  No fetal anomalies or markers seen  Reduction in risk for  fetal aneuploidy  Offered additional testing and screening  Declined NIPS and amniocentesis  Reviewed family history concerns   Gershon Crane, MS, Aeronautical engineer

## 2020-05-24 DIAGNOSIS — O09899 Supervision of other high risk pregnancies, unspecified trimester: Secondary | ICD-10-CM

## 2020-05-24 DIAGNOSIS — O0990 Supervision of high risk pregnancy, unspecified, unspecified trimester: Secondary | ICD-10-CM

## 2020-05-24 DIAGNOSIS — O09299 Supervision of pregnancy with other poor reproductive or obstetric history, unspecified trimester: Secondary | ICD-10-CM

## 2020-06-05 ENCOUNTER — Encounter: Payer: Self-pay | Admitting: Family Medicine

## 2020-06-05 ENCOUNTER — Telehealth (INDEPENDENT_AMBULATORY_CARE_PROVIDER_SITE_OTHER): Payer: Self-pay | Admitting: Family Medicine

## 2020-06-05 VITALS — BP 113/78 | HR 96

## 2020-06-05 DIAGNOSIS — O0990 Supervision of high risk pregnancy, unspecified, unspecified trimester: Secondary | ICD-10-CM

## 2020-06-05 DIAGNOSIS — O09299 Supervision of pregnancy with other poor reproductive or obstetric history, unspecified trimester: Secondary | ICD-10-CM

## 2020-06-05 DIAGNOSIS — O09899 Supervision of other high risk pregnancies, unspecified trimester: Secondary | ICD-10-CM

## 2020-06-05 DIAGNOSIS — O34219 Maternal care for unspecified type scar from previous cesarean delivery: Secondary | ICD-10-CM

## 2020-06-05 DIAGNOSIS — O09212 Supervision of pregnancy with history of pre-term labor, second trimester: Secondary | ICD-10-CM

## 2020-06-05 DIAGNOSIS — Z3A25 25 weeks gestation of pregnancy: Secondary | ICD-10-CM

## 2020-06-05 DIAGNOSIS — O09292 Supervision of pregnancy with other poor reproductive or obstetric history, second trimester: Secondary | ICD-10-CM

## 2020-06-05 DIAGNOSIS — O365920 Maternal care for other known or suspected poor fetal growth, second trimester, not applicable or unspecified: Secondary | ICD-10-CM

## 2020-06-05 DIAGNOSIS — Z98891 History of uterine scar from previous surgery: Secondary | ICD-10-CM

## 2020-06-05 DIAGNOSIS — O280 Abnormal hematological finding on antenatal screening of mother: Secondary | ICD-10-CM

## 2020-06-05 NOTE — Progress Notes (Signed)
I connected with Kelli Robbins 06/05/20 at  8:15 AM EST by: MyChart video and verified that I am speaking with the correct person using two identifiers.  Patient is located at home and provider is located at [facility].     The purpose of this virtual visit is to provide medical care while limiting exposure to the novel coronavirus. I discussed the limitations, risks, security and privacy concerns of performing an evaluation and management service by MyChart video and the availability of in person appointments. I also discussed with the patient that there may be a patient responsible charge related to this service. By engaging in this virtual visit, you consent to the provision of healthcare.  Additionally, you authorize for your insurance to be billed for the services provided during this visit.  The patient expressed understanding and agreed to proceed.  The following staff members participated in the virtual visit:  Venora Maples, MD/MPH    PRENATAL VISIT NOTE  Subjective:  Kelli Robbins is a 35 y.o. [id number] at [redacted]w[redacted]d  for phone visit for ongoing prenatal care.  She is currently monitored for the following issues for this high-risk pregnancy and has Supervision of high risk pregnancy, antepartum; History of preterm delivery, currently pregnant; History of pre-eclampsia in prior pregnancy, currently pregnant; History of cesarean section; and Abnormal quad screen on their problem list.  Patient reports no complaints.  Contractions: Not present. Vag. Bleeding: None.  Movement: Present. Denies leaking of fluid.   The following portions of the patient's history were reviewed and updated as appropriate: allergies, current medications, past family history, past medical history, past social history, past surgical history and problem list.   Objective:   Vitals:   06/05/20 0825  BP: 113/78  Pulse: 96   Self-Obtained  Fetal Status:     Movement: Present     Assessment and  Plan:  Pregnancy: G3P1102 at [redacted]w[redacted]d 1. History of pre-eclampsia in prior pregnancy, currently pregnant   2. History of preterm delivery, currently pregnant At 7 months [place]  3. Supervision of high risk pregnancy, antepartum BP normal Good fetal movement Discussed contraception, she does not desire more children Counseled on and accepts BTL Counseled on salpingectomy but prefers BTL  4. Poor fetal growth affecting management of mother in second trimester, single or unspecified fetus 4% on [facility] [place], now resolved with most recent MFM scan EFW 46%  5. History of cesarean section X2, plan for repeat at 39 weeks  6. Abnormal quad screen Increased risk for down syndrome S/p genetic counseling, declined further testing  Preterm labor symptoms and general obstetric precautions including but not limited to vaginal bleeding, contractions, leaking of fluid and fetal movement were reviewed in detail with the patient.  Return in 3 weeks (on 06/26/2020) for [facility], ob visit, 28 wk labs.  Future Appointments  Date Time Provider Department Center  06/11/2020 10:15 AM [facility] NURSE [facility]  06/11/2020 10:45 AM [facility]     Time spent on virtual visit: 11 minutes  Venora Maples, MD

## 2020-06-05 NOTE — Patient Instructions (Signed)
 Eleccin del mtodo anticonceptivo Contraception Choices La anticoncepcin, o los mtodos anticonceptivos, hace referencia a los mtodos o dispositivos que evitan el embarazo. Mtodos hormonales Implante anticonceptivo Un implante anticonceptivo consiste en un tubo delgado de plstico que contiene una hormona que evita el embarazo. Es diferente de un dispositivo intrauterino (DIU). Un mdico lo inserta en la parte superior del brazo. Los implantes pueden ser eficaces durante un mximo de 3 aos. Inyecciones de progestina sola Las inyecciones de progestina sola contienen progestina, una forma sinttica de la hormona progesterona. Un mdico las administra cada 3 meses. Pldoras anticonceptivas Las pldoras anticonceptivas son pastillas que contienen hormonas que evitan el embarazo. Deben tomarse una vez al da, preferentemente a la misma hora cada da. Se necesita una receta para utilizar este mtodo anticonceptivo. Parche anticonceptivo El parche anticonceptivo contiene hormonas que evitan el embarazo. Se coloca en la piel, debe cambiarse una vez a la semana durante tres semanas y debe retirarse en la cuarta semana. Se necesita una receta para utilizar este mtodo anticonceptivo. Anillo vaginal Un anillo vaginal contiene hormonas que evitan el embarazo. Se coloca en la vagina durante tres semanas y se retira en la cuarta semana. Luego se repite el proceso con un anillo nuevo. Se necesita una receta para utilizar este mtodo anticonceptivo. Anticonceptivo de emergencia Los anticonceptivos de emergencia son mtodos para evitar un embarazo despus de tener sexo sin proteccin. Vienen en forma de pldora y pueden tomarse hasta 5 das despus de tener sexo. Funcionan mejor cuando se toman lo ms pronto posible luego de tener sexo. La mayora de los anticonceptivos de emergencia estn disponibles sin receta mdica. Este mtodo no debe utilizarse como el nico mtodo anticonceptivo.   Mtodos de  barrera Condn masculino Un condn masculino es una vaina delgada que se coloca sobre el pene durante el sexo. Los condones evitan que el esperma ingrese en el cuerpo de la mujer. Pueden utilizarse con un una sustancia que mata a los espermatozoides (espermicida) para aumentar la efectividad. Deben desecharse despus de un uso. Condn femenino Un condn femenino es una vaina blanda y holgada que se coloca en la vagina antes de tener sexo. El condn evita que el esperma ingrese en el cuerpo de la mujer. Deben desecharse despus de un uso. Diafragma Un diafragma es una barrera blanda con forma de cpula. Se inserta en la vagina antes del sexo, junto con un espermicida. El diafragma bloquea el ingreso de esperma en el tero, y el espermicida mata a los espermatozoides. El diafragma debe permanecer en la vagina durante 6 a 8 horas despus de tener sexo y debe retirarse en el plazo de las 24 horas. Un diafragma es recetado y colocado por un mdico. Debe reemplazarse cada 1 a 2 aos, despus de dar a luz, de aumentar ms de 15lb (6.8kg) y de una ciruga plvica. Capuchn cervical Un capuchn cervical es una copa redonda y blanda de ltex o plstico que se coloca en el cuello uterino. Se inserta en la vagina antes del sexo, junto con un espermicida. Bloquea el ingreso del esperma en el tero. El capuchn debe permanecer en el lugar durante 6 a 8 horas despus de tener sexo y debe retirarse en el plazo de las 48 horas. Un capuchn cervical debe ser recetado y colocado por un mdico. Debe reemplazarse cada 2aos. Esponja Una esponja es una pieza blanda y circular de espuma de poliuretano que contiene espermicida. La esponja ayuda a bloquear el ingreso de esperma en el tero, y el   espermicida mata a los espermatozoides. Para utilizarla, debe humedecerla e insertarla en la vagina. Debe insertarse antes de tener sexo, debe permanecer dentro al menos durante 6 horas despus de tener sexo y debe retirarse y  desecharse en el plazo de las 30 horas. Espermicidas Los espermicidas son sustancias qumicas que matan o bloquean al esperma y no lo dejan ingresar al cuello uterino y al tero. Vienen en forma de crema, gel, supositorio, espuma o comprimido. Un espermicida debe insertarse en la vagina con un aplicador al menos 10 o 15 minutos antes de tener sexo para dar tiempo a que surta efecto. El proceso debe repetirse cada vez que tenga sexo. Los espermicidas no requieren receta mdica.   Anticonceptivos intrauterinos Dispositivo intrauterino (DIU) Un DIU es un dispositivo en forma de T que se coloca en el tero. Existen dos tipos:  DIU hormonal.Este tipo contiene progestina, una forma sinttica de la hormona progesterona. Este tipo puede permanecer colocado durante 3 a 5 aos.  DIU de cobre.Este tipo est recubierto con un alambre de cobre. Puede permanecer colocado durante 10 aos. Mtodos anticonceptivos permanentes Ligadura de trompas en la mujer En este mtodo, se sellan, atan u obstruyen las trompas de Falopio durante una ciruga para evitar que el vulo descienda hacia el tero. Esterilizacin histeroscpica En este mtodo, se coloca un implante pequeo y flexible dentro de cada trompa de Falopio. Los implantes hacen que se forme un tejido cicatricial en las trompas de Falopio y que las obstruya para que el espermatozoide no pueda llegar al vulo. El procedimiento demora alrededor de 3 meses para que sea efectivo. Debe utilizarse otro mtodo anticonceptivo durante esos 3 meses. Esterilizacin masculina Este es un procedimiento que consiste en atar los conductos que transportan el esperma (vasectoma). Luego del procedimiento, el hombre puede eyacular lquido (semen). Debe utilizarse otro mtodo anticonceptivo durante 3 meses despus del procedimiento. Mtodos de planificacin natural Planificacin familiar natural En este mtodo, la pareja no tiene sexo durante los das en que la mujer podra quedar  embarazada. Mtodo calendario En este mtodo, la mujer realiza un seguimiento de la duracin de cada ciclo menstrual, identifica los das en los que se puede producir un embarazo y no tiene sexo durante esos das. Mtodo de la ovulacin En este mtodo, la pareja evita tener sexo durante la ovulacin. Mtodo sintotrmico Este mtodo implica no tener sexo durante la ovulacin. Normalmente, la mujer comprueba la ovulacin al observar cambios en su temperatura y en la consistencia del moco cervical. Mtodo posovulacin En este mtodo, la pareja espera a que finalice la ovulacin para tener sexo. Dnde buscar ms informacin  Centers for Disease Control and Prevention (Centros para el Control y la Prevencin de Enfermedades): www.cdc.gov Resumen  La anticoncepcin, o los mtodos anticonceptivos, hace referencia a los mtodos o dispositivos que evitan el embarazo.  Los mtodos anticonceptivos hormonales incluyen implantes, inyecciones, pastillas, parches, anillos vaginales y anticonceptivos de emergencia.  Los mtodos anticonceptivos de barrera pueden incluir condones masculinos, condones femeninos, diafragmas, capuchones cervicales, esponjas y espermicidas.  Existen dos tipos de DIU (dispositivo intrauterino). Un DIU puede colocarse en el tero de una mujer para evitar el embarazo durante 3 a 5 aos.  La esterilizacin permanente puede realizarse mediante un procedimiento tanto en los hombres como en las mujeres. Los mtodos de planificacin familiar natural implican no tener sexo durante los das en que la mujer podra quedar embarazada. Esta informacin no tiene como fin reemplazar el consejo del mdico. Asegrese de hacerle al mdico cualquier pregunta que   tenga. Document Revised: 11/01/2019 Document Reviewed: 11/01/2019 Elsevier Patient Education  2021 [employer] Inc.   Lactancia materna Breastfeeding  Decidir amamantar es una de las mejores elecciones que puede hacer por usted y su  beb. Un cambio en las hormonas durante el embarazo hace que las mamas produzcan leche materna en las glndulas productoras de leche. Las hormonas impiden que la leche materna sea liberada antes del nacimiento del beb. Adems, impulsan el flujo de leche luego del nacimiento. Una vez que ha comenzado a amamantar, pensar en el beb, as como la succin o el llanto, pueden estimular la liberacin de leche de las glndulas productoras de leche. Los beneficios de amamantar Las investigaciones demuestran que la lactancia materna ofrece muchos beneficios de salud para bebs y madres. Adems, ofrece una forma gratuita y conveniente de alimentar al beb. Para el beb  La primera leche (calostro) ayuda a mejorar el funcionamiento del aparato digestivo del beb.  Las clulas especiales de la leche (anticuerpos) ayudan a combatir las infecciones en el beb.  Los bebs que se alimentan con leche materna tambin tienen menos probabilidades de tener asma, alergias, obesidad o diabetes de tipo 2. Adems, tienen menor riesgo de sufrir el sndrome de muerte sbita del lactante (SMSL).  Los nutrientes de la leche materna son mejores para satisfacer las necesidades del beb en comparacin con la leche maternizada.  La leche materna mejora el desarrollo cerebral del beb. Para usted  La lactancia materna favorece el desarrollo de un vnculo muy especial entre la madre y el beb.  Es conveniente. La leche materna es econmica y siempre est disponible a la temperatura correcta.  La lactancia materna ayuda a quemar caloras. Le ayuda a perder el peso ganado durante el embarazo.  Hace que el tero vuelva al tamao que tena antes del embarazo ms rpido. Adems, disminuye el sangrado (loquios) despus del parto.  La lactancia materna contribuye a reducir el riesgo de tener diabetes de tipo 2, osteoporosis, artritis reumatoide, enfermedades cardiovasculares y cncer de mama, ovario, tero y endometrio en el  futuro. Informacin bsica sobre la lactancia Comienzo de la lactancia  Encuentre un lugar cmodo para sentarse o acostarse, con un buen respaldo para el cuello y la espalda.  Coloque una almohada o una manta enrollada debajo del beb para acomodarlo a la altura de la mama (si est sentada). Las almohadas para amamantar se han diseado especialmente a fin de servir de apoyo para los brazos y el beb mientras amamanta.  Asegrese de que la barriga del beb (abdomen) est frente a la suya.  Masajee suavemente la mama. Con las yemas de los dedos, masajee los bordes exteriores de la mama hacia adentro, en direccin al pezn. Esto estimula el flujo de leche. Si la leche fluye lentamente, es posible que deba continuar con este movimiento durante la lactancia.  Sostenga la mama con 4 dedos por debajo y el pulgar por arriba del pezn (forme la letra "C" con la mano). Asegrese de que los dedos se encuentren lejos del pezn y de la boca del beb.  Empuje suavemente los labios del beb con el pezn o con el dedo.  Cuando la boca del beb se abra lo suficiente, acrquelo rpidamente a la mama e introduzca todo el pezn y la arola, tanto como sea posible, dentro de la boca del beb. La arola es la zona de color que rodea al pezn. ? Debe haber ms arola visible por arriba del labio superior del beb que por debajo del labio   inferior. ? Los labios del beb deben estar abiertos y extendidos hacia afuera (evertidos) para asegurar que el beb se prenda de forma adecuada y cmoda. ? La lengua del beb debe estar entre la enca inferior y la mama.  Asegrese de que la boca del beb est en la posicin correcta alrededor del pezn (prendido). Los labios del beb deben crear un sello sobre la mama y estar doblados hacia afuera (invertidos).  Es comn que el beb succione durante 2 a 3 minutos para que comience el flujo de leche materna. Cmo debe prenderse Es muy importante que le ensee al beb cmo  prenderse adecuadamente a la mama. Si el beb no se prende adecuadamente, puede causar dolor en los pezones, reducir la produccin de leche materna y hacer que el beb tenga un escaso aumento de peso. Adems, si el beb no se prende adecuadamente al pezn, puede tragar aire durante la alimentacin. Esto puede causarle molestias al beb. Hacer eructar al beb al cambiar de mama puede ayudarlo a liberar el aire. Sin embargo, ensearle al beb cmo prenderse a la mama adecuadamente es la mejor manera de evitar que se sienta molesto por tragar aire mientras se alimenta. Signos de que el beb se ha prendido adecuadamente al pezn  Tironea o succiona de modo silencioso, sin causarle dolor. Los labios del beb deben estar extendidos hacia afuera (evertidos).  Se escucha que traga cada 3 o 4 succiones una vez que la leche ha comenzado a fluir (despus de que se produzca el reflejo de eyeccin de la leche).  Hay movimientos musculares por arriba y por delante de sus odos al succionar. Signos de que el beb no se ha prendido adecuadamente al pezn  Hace ruidos de succin o de chasquido mientras se alimenta.  Siente dolor en los pezones. Si cree que el beb no se prendi correctamente, deslice el dedo en la comisura de la boca y colquelo entre las encas del beb para interrumpir la succin. Intente volver a comenzar a amamantar. Signos de lactancia materna exitosa Signos del beb  El beb disminuir gradualmente el nmero de succiones o dejar de succionar por completo.  El beb se quedar dormido.  El cuerpo del beb se relajar.  El beb retendr una pequea cantidad de leche en la boca.  El beb se desprender solo del pecho. Signos que presenta usted  Las mamas han aumentado la firmeza, el peso y el tamao 1 a 3 horas despus de amamantar.  Estn ms blandas inmediatamente despus de amamantar.  Se producen un aumento del volumen de leche y un cambio en su consistencia y color hacia el  quinto da de lactancia.  Los pezones no duelen, no estn agrietados ni sangran. Signos de que su beb recibe la cantidad de leche suficiente  Mojar por lo menos 1 o 2paales durante las primeras 24horas despus del nacimiento.  Mojar por lo menos 5 o 6paales cada 24horas durante la primera semana despus del nacimiento. La orina debe ser clara o de color amarillo plido a los 5das de vida.  Mojar entre 6 y 8paales cada 24horas a medida que el beb sigue creciendo y desarrollndose.  Defeca por lo menos 3 veces en 24 horas a los 5 das de vida. Las heces deben ser blandas y amarillentas.  Defeca por lo menos 3 veces en 24 horas a los 7 das de vida. Las heces deben ser grumosas y amarillentas.  No registra una prdida de peso mayor al 10% del peso al   nacer durante los primeros 3 das de vida.  Aumenta de peso un promedio de 4 a 7onzas (113 a 198g) por semana despus de los 4 das de vida.  Aumenta de peso, diariamente, de manera uniforme a partir de los 5 das de vida, sin registrar prdida de peso despus de las 2semanas de vida. Despus de alimentarse, es posible que el beb regurgite una pequea cantidad de leche. Esto es normal. Frecuencia y duracin de la lactancia El amamantamiento frecuente la ayudar a producir ms leche y puede prevenir dolores en los pezones y las mamas extremadamente llenas (congestin mamaria). Alimente al beb cuando muestre signos de hambre o si siente la necesidad de reducir la congestin de las mamas. Esto se denomina "lactancia a demanda". Las seales de que el beb tiene hambre incluyen las siguientes:  Aumento del estado de alerta, actividad o inquietud.  Mueve la cabeza de un lado a otro.  Abre la boca cuando se le toca la mejilla o la comisura de la boca (reflejo de bsqueda).  Aumenta las vocalizaciones, tales como sonidos de succin, se relame los labios, emite arrullos, suspiros o chirridos.  Mueve la mano hacia la boca y se chupa  los dedos o las manos.  Est molesto o llora. Evite el uso del chupete en las primeras 4 a 6 semanas despus del nacimiento del beb. Despus de este perodo, podr usar un chupete. Las investigaciones demostraron que el uso del chupete durante el primer ao de vida del beb disminuye el riesgo de tener el sndrome de muerte sbita del lactante (SMSL). Permita que el nio se alimente en cada mama todo lo que desee. Cuando el beb se desprende o se queda dormido mientras se est alimentando de la primera mama, ofrzcale la segunda. Debido a que, con frecuencia, los recin nacidos estn somnolientos las primeras semanas de vida, es posible que deba despertar al beb para alimentarlo. Los horarios de lactancia varan de un beb a otro. Sin embargo, las siguientes reglas pueden servir como gua para ayudarla a garantizar que el beb se alimenta adecuadamente:  Se puede amamantar a los recin nacidos (bebs de 4 semanas o menos de vida) cada 1 a 3 horas.  No deben transcurrir ms de 3 horas durante el da o 5 horas durante la noche sin que se amamante a los recin nacidos.  Debe amamantar al beb un mnimo de 8 veces en un perodo de 24 horas. Extraccin de leche materna La extraccin y el almacenamiento de la leche materna le permiten asegurarse de que el beb se alimente exclusivamente de su leche materna, aun en momentos en los que no puede amamantar. Esto tiene especial importancia si debe regresar al trabajo en el perodo en que an est amamantando o si no puede estar presente en los momentos en que el beb debe alimentarse. Su asesor en lactancia puede ayudarla a encontrar un mtodo de extraccin que funcione mejor para usted y orientarla sobre cunto tiempo es seguro almacenar leche materna.      Cmo cuidar las mamas durante la lactancia Los pezones pueden secarse, agrietarse y doler durante la lactancia. Las siguientes recomendaciones pueden ayudarla a mantener las mamas humectadas y  sanas:  Evite usar jabn en los pezones.  Use un sostn de soporte diseado especialmente para la lactancia materna. Evite usar sostenes con aro o sostenes muy ajustados (sostenes deportivos).  Seque al aire sus pezones durante 3 a 4minutos despus de amamantar al beb.  Utilice solo apsitos de algodn en   el sostn para absorber las prdidas de leche. La prdida de un poco de leche materna entre las tomas es normal.  Utilice lanolina sobre los pezones luego de amamantar. La lanolina ayuda a mantener la humedad normal de la piel. La lanolina pura no es perjudicial (no es txica) para el beb. Adems, puede extraer manualmente algunas gotas de leche materna y masajear suavemente esa leche sobre los pezones para que la leche se seque al aire. Durante las primeras semanas despus del nacimiento, algunas mujeres experimentan congestin mamaria. La congestin mamaria puede hacer que sienta las mamas pesadas, calientes y sensibles al tacto. El pico de la congestin mamaria ocurre en el plazo de los 3 a 5 das despus del parto. Las siguientes recomendaciones pueden ayudarla a aliviar la congestin mamaria:  Vace por completo las mamas al amamantar o extraer leche. Puede aplicar calor hmedo en las mamas (en la ducha o con toallas hmedas para manos) antes de amamantar o extraer leche. Esto aumenta la circulacin y ayuda a que la leche fluya. Si el beb no vaca por completo las mamas cuando lo amamanta, extraiga la leche restante despus de que haya finalizado.  Aplique compresas de hielo sobre las mamas inmediatamente despus de amamantar o extraer leche, a menos que le resulte demasiado incmodo. Haga lo siguiente: ? Ponga el hielo en una bolsa plstica. ? Coloque una toalla entre la piel y la bolsa de hielo. ? Coloque el hielo durante 20minutos, 2 o 3veces por da.  Asegrese de que el beb est prendido y se encuentre en la posicin correcta mientras lo alimenta. Si la congestin mamaria  persiste luego de 48 horas o despus de seguir estas recomendaciones, comunquese con su mdico o un asesor en lactancia. Recomendaciones de salud general durante la lactancia  Consuma 3 comidas y 3 colaciones saludables todos los das. Las madres bien alimentadas que amamantan necesitan entre 450 y 500 caloras adicionales por da. Puede cumplir con este requisito al aumentar la cantidad de una dieta equilibrada que realice.  Beba suficiente agua para mantener la orina clara o de color amarillo plido.  Descanse con frecuencia, reljese y siga tomando sus vitaminas prenatales para prevenir la fatiga, el estrs y los niveles bajos de vitaminas y minerales en el cuerpo (deficiencias de nutrientes).  No consuma ningn producto que contenga nicotina o tabaco, como cigarrillos y cigarrillos electrnicos. El beb puede verse afectado por las sustancias qumicas de los cigarrillos que pasan a la leche materna y por la exposicin al humo ambiental del tabaco. Si necesita ayuda para dejar de fumar, consulte al mdico.  Evite el consumo de alcohol.  No consuma drogas ilegales o marihuana.  Antes de usar cualquier medicamento, hable con el mdico. Estos incluyen medicamentos recetados y de venta libre, como tambin vitaminas y suplementos a base de hierbas. Algunos medicamentos, que pueden ser perjudiciales para el beb, pueden pasar a travs de la leche materna.  Puede quedar embarazada durante la lactancia. Si se desea un mtodo anticonceptivo, consulte al mdico sobre cules son las opciones seguras durante la lactancia. Dnde encontrar ms informacin: Liga internacional La Leche: www.llli.org. Comunquese con un mdico si:  Siente que quiere dejar de amamantar o se siente frustrada con la lactancia.  Sus pezones estn agrietados o sangran.  Sus mamas estn irritadas, sensibles o calientes.  Tiene los siguientes sntomas: ? Dolor en las mamas o en los pezones. ? Un rea hinchada en cualquiera  de las mamas. ? Fiebre o escalofros. ? Nuseas o vmitos. ?   Drenaje de otro lquido distinto de la leche materna desde los pezones.  Sus mamas no se llenan antes de amamantar al beb para el quinto da despus del parto.  Se siente triste y deprimida.  El beb: ? Est demasiado somnoliento como para comer bien. ? Tiene problemas para dormir. ? Tiene ms de 1 semana de vida y moja menos de 6 paales en un periodo de 24 horas. ? No ha aumentado de peso a los 5 das de vida.  El beb defeca menos de 3 veces en 24 horas.  La piel del beb o las partes blancas de los ojos se vuelven amarillentas. Solicite ayuda de inmediato si:  El beb est muy cansado (letargo) y no se quiere despertar para comer.  Le sube la fiebre sin causa. Resumen  La lactancia materna ofrece muchos beneficios de salud para bebs y madres.  Intente amamantar a su beb cuando muestre signos tempranos de hambre.  Haga cosquillas o empuje suavemente los labios del beb con el dedo o el pezn para lograr que el beb abra la boca. Acerque el beb a la mama. Asegrese de que la mayor parte de la arola se encuentre dentro de la boca del beb. Ofrzcale una mama y haga eructar al beb antes de pasar a la otra.  Hable con su mdico o asesor en lactancia si tiene dudas o problemas con la lactancia. Esta informacin no tiene como fin reemplazar el consejo del mdico. Asegrese de hacerle al mdico cualquier pregunta que tenga. Document Revised: 06/25/2017 Document Reviewed: 07/21/2016 Elsevier Patient Education  2021 [employer] Inc.  

## 2020-06-05 NOTE — Progress Notes (Signed)
I connected with  Kelli Robbins on 06/05/20 at [phone] by telephone with [facility] interpreter Francesco Runner ID [phone] and verified that I am speaking with the correct person using two identifiers.   I discussed the limitations, risks, security and privacy concerns of performing an evaluation and management service by telephone and the availability of in person appointments. I also discussed with the patient that there may be a patient responsible charge related to this service. The patient expressed understanding and agreed to proceed.  Marjo Bicker, RN 06/05/2020  8:23 AM

## 2020-06-11 ENCOUNTER — Encounter: Payer: Self-pay | Admitting: *Deleted

## 2020-06-11 ENCOUNTER — Other Ambulatory Visit: Payer: Self-pay | Admitting: *Deleted

## 2020-06-11 ENCOUNTER — Ambulatory Visit: Payer: Self-pay | Attending: Obstetrics and Gynecology

## 2020-06-11 ENCOUNTER — Other Ambulatory Visit: Payer: Self-pay

## 2020-06-11 ENCOUNTER — Ambulatory Visit: Payer: Self-pay | Admitting: *Deleted

## 2020-06-11 DIAGNOSIS — O09899 Supervision of other high risk pregnancies, unspecified trimester: Secondary | ICD-10-CM | POA: Insufficient documentation

## 2020-06-11 DIAGNOSIS — O0990 Supervision of high risk pregnancy, unspecified, unspecified trimester: Secondary | ICD-10-CM

## 2020-06-11 DIAGNOSIS — O09522 Supervision of elderly multigravida, second trimester: Secondary | ICD-10-CM

## 2020-06-11 DIAGNOSIS — O09299 Supervision of pregnancy with other poor reproductive or obstetric history, unspecified trimester: Secondary | ICD-10-CM

## 2020-06-26 ENCOUNTER — Other Ambulatory Visit: Payer: Self-pay

## 2020-06-26 ENCOUNTER — Other Ambulatory Visit: Payer: Self-pay | Admitting: Lactation Services

## 2020-06-26 ENCOUNTER — Encounter: Payer: Self-pay | Admitting: Family Medicine

## 2020-06-26 ENCOUNTER — Ambulatory Visit (INDEPENDENT_AMBULATORY_CARE_PROVIDER_SITE_OTHER): Payer: Self-pay | Admitting: Family Medicine

## 2020-06-26 VITALS — BP 120/67 | HR 89 | Wt 182.8 lb

## 2020-06-26 DIAGNOSIS — O0990 Supervision of high risk pregnancy, unspecified, unspecified trimester: Secondary | ICD-10-CM

## 2020-06-26 DIAGNOSIS — O09899 Supervision of other high risk pregnancies, unspecified trimester: Secondary | ICD-10-CM

## 2020-06-26 DIAGNOSIS — Z23 Encounter for immunization: Secondary | ICD-10-CM

## 2020-06-26 DIAGNOSIS — Z98891 History of uterine scar from previous surgery: Secondary | ICD-10-CM

## 2020-06-26 DIAGNOSIS — O09299 Supervision of pregnancy with other poor reproductive or obstetric history, unspecified trimester: Secondary | ICD-10-CM

## 2020-06-26 DIAGNOSIS — O280 Abnormal hematological finding on antenatal screening of mother: Secondary | ICD-10-CM

## 2020-06-26 NOTE — Patient Instructions (Addendum)
Compression stockings    [facility] [address] [place],  [place]  [address] Get Driving Directions Main: [phone]

## 2020-06-26 NOTE — Progress Notes (Signed)
   Subjective:  Shamiya Demeritt is a 35 y.o. [phone] at [redacted]w[redacted]d being seen today for ongoing prenatal care.  She is currently monitored for the following issues for this high-risk pregnancy and has Supervision of high risk pregnancy, antepartum; History of preterm delivery, currently pregnant; History of pre-eclampsia in prior pregnancy, currently pregnant; History of cesarean section; and Abnormal quad screen on their problem list.  Patient reports no complaints.  Contractions: Irritability.  .  Movement: Present. Denies leaking of fluid.   The following portions of the patient's history were reviewed and updated as appropriate: allergies, current medications, past family history, past medical history, past social history, past surgical history and problem list. Problem list updated.  Objective:   Vitals:   06/26/20 0856  BP: 120/67  Pulse: 89  Weight: 182 lb 12.8 oz (82.9 kg)    Fetal Status: Fetal Heart Rate (bpm): 141   Movement: Present     General:  Alert, oriented and cooperative. Patient is in no acute distress.  Skin: Skin is warm and dry. No rash noted.   Cardiovascular: Normal heart rate noted  Respiratory: Normal respiratory effort, no problems with respiration noted  Abdomen: Soft, gravid, appropriate for gestational age. Pain/Pressure: Absent     Pelvic:       Cervical exam deferred        Extremities: Normal range of motion.  Edema: None  Mental Status: Normal mood and affect. Normal behavior. Normal judgment and thought content.   Urinalysis:      Assessment and Plan:  Pregnancy: G3P1102 at [redacted]w[redacted]d  1. Supervision of high risk pregnancy, antepartum BP and FHR normal 28wk labs and TDaP today - Tdap vaccine greater than or equal to 7yo IM  2. History of preterm delivery, currently pregnant At 7 months in [place]  3. History of pre-eclampsia in prior pregnancy, currently pregnant On baby ASA  4. History of cesarean section X2, repeat at 39 weeks  5.  Abnormal quad screen Elevated risk for Down, normal [place], declined amnio, s/p genetic testing  Preterm labor symptoms and general obstetric precautions including but not limited to vaginal bleeding, contractions, leaking of fluid and fetal movement were reviewed in detail with the patient. Please refer to After Visit Summary for other counseling recommendations.  Return in 2 weeks (on 07/10/2020) for [facility], ob visit.   Venora Maples, MD

## 2020-06-27 ENCOUNTER — Encounter: Payer: Self-pay | Admitting: Family Medicine

## 2020-06-27 DIAGNOSIS — Z8632 Personal history of gestational diabetes: Secondary | ICD-10-CM | POA: Insufficient documentation

## 2020-06-27 DIAGNOSIS — O24419 Gestational diabetes mellitus in pregnancy, unspecified control: Secondary | ICD-10-CM | POA: Insufficient documentation

## 2020-06-27 LAB — CBC
Hematocrit: 34.4 % (ref 34.0–46.6)
Hemoglobin: 11.5 g/dL (ref 11.1–15.9)
MCH: 30.2 pg (ref 26.6–33.0)
MCHC: 33.4 g/dL (ref 31.5–35.7)
MCV: 90 fL (ref 79–97)
Platelets: 343 10*3/uL (ref 150–450)
RBC: 3.81 x10E6/uL (ref 3.77–5.28)
RDW: 12.8 % (ref 11.7–15.4)
WBC: 9.1 10*3/uL (ref 3.4–10.8)

## 2020-06-27 LAB — HIV ANTIBODY (ROUTINE TESTING W REFLEX): HIV Screen 4th Generation wRfx: NONREACTIVE

## 2020-06-27 LAB — GLUCOSE TOLERANCE, 2 HOURS W/ 1HR
Glucose, 1 hour: 188 mg/dL — ABNORMAL HIGH (ref 65–179)
Glucose, 2 hour: 98 mg/dL (ref 65–152)
Glucose, Fasting: 101 mg/dL — ABNORMAL HIGH (ref 65–91)

## 2020-06-27 LAB — RPR: RPR Ser Ql: NONREACTIVE

## 2020-06-28 ENCOUNTER — Telehealth: Payer: Self-pay | Admitting: Lactation Services

## 2020-06-28 NOTE — Telephone Encounter (Signed)
Called patient with assistance of [employer], Research officer, trade union.   Patient informed her blood sugars are elevated and that she has Gestational Diabetes. Patient informed she will meet with Diabetes Educator and start checking her blood sugars. All questions answered.   Message to front office to call patient to schedule with Diabetes Educator.

## 2020-06-28 NOTE — Telephone Encounter (Signed)
-----   Message from Venora Maples, MD sent at 06/27/2020 11:59 AM EDT ----- Please call patient and let her know 3rd trimester labs notable for GDM. Please coordinate DM educator visit and testing supplies.

## 2020-07-10 ENCOUNTER — Other Ambulatory Visit: Payer: Self-pay

## 2020-07-10 ENCOUNTER — Ambulatory Visit: Payer: Self-pay | Admitting: *Deleted

## 2020-07-10 ENCOUNTER — Ambulatory Visit: Payer: Self-pay | Attending: Obstetrics

## 2020-07-10 ENCOUNTER — Encounter: Payer: Self-pay | Admitting: *Deleted

## 2020-07-10 ENCOUNTER — Ambulatory Visit (INDEPENDENT_AMBULATORY_CARE_PROVIDER_SITE_OTHER): Payer: Self-pay | Admitting: Family Medicine

## 2020-07-10 ENCOUNTER — Other Ambulatory Visit: Payer: Self-pay | Admitting: *Deleted

## 2020-07-10 ENCOUNTER — Encounter: Payer: Self-pay | Admitting: Family Medicine

## 2020-07-10 VITALS — BP 108/60 | HR 89 | Wt 185.0 lb

## 2020-07-10 DIAGNOSIS — O0990 Supervision of high risk pregnancy, unspecified, unspecified trimester: Secondary | ICD-10-CM

## 2020-07-10 DIAGNOSIS — O280 Abnormal hematological finding on antenatal screening of mother: Secondary | ICD-10-CM

## 2020-07-10 DIAGNOSIS — O09899 Supervision of other high risk pregnancies, unspecified trimester: Secondary | ICD-10-CM

## 2020-07-10 DIAGNOSIS — O09522 Supervision of elderly multigravida, second trimester: Secondary | ICD-10-CM | POA: Insufficient documentation

## 2020-07-10 DIAGNOSIS — O09299 Supervision of pregnancy with other poor reproductive or obstetric history, unspecified trimester: Secondary | ICD-10-CM

## 2020-07-10 DIAGNOSIS — Z98891 History of uterine scar from previous surgery: Secondary | ICD-10-CM

## 2020-07-10 DIAGNOSIS — O24410 Gestational diabetes mellitus in pregnancy, diet controlled: Secondary | ICD-10-CM

## 2020-07-10 NOTE — Progress Notes (Signed)
   Subjective:  Kelli Robbins is a 35 y.o. [phone] at [redacted]w[redacted]d being seen today for ongoing prenatal care.  She is currently monitored for the following issues for this high-risk pregnancy and has Supervision of high risk pregnancy, antepartum; History of preterm delivery, currently pregnant; History of pre-eclampsia in prior pregnancy, currently pregnant; History of cesarean section; Abnormal quad screen; and Gestational diabetes mellitus on their problem list.  Patient reports no complaints.  Contractions: Not present. Vag. Bleeding: None.  Movement: Present. Denies leaking of fluid.   The following portions of the patient's history were reviewed and updated as appropriate: allergies, current medications, past family history, past medical history, past social history, past surgical history and problem list. Problem list updated.  Objective:   Vitals:   07/10/20 1115  BP: 108/60  Pulse: 89  Weight: 185 lb (83.9 kg)    Fetal Status: Fetal Heart Rate (bpm): 152   Movement: Present     General:  Alert, oriented and cooperative. Patient is in no acute distress.  Skin: Skin is warm and dry. No rash noted.   Cardiovascular: Normal heart rate noted  Respiratory: Normal respiratory effort, no problems with respiration noted  Abdomen: Soft, gravid, appropriate for gestational age. Pain/Pressure: Absent     Pelvic: Vag. Bleeding: None     Cervical exam deferred        Extremities: Normal range of motion.  Edema: None  Mental Status: Normal mood and affect. Normal behavior. Normal judgment and thought content.   Urinalysis:      Assessment and Plan:  Pregnancy: G3P1102 at [redacted]w[redacted]d  1. Supervision of high risk pregnancy, antepartum BP and FHR normal  2. Diet controlled gestational diabetes mellitus (GDM) in third trimester Unable to see DM educator today, will go on Thursday Emphasized importance of checking sugars QID Following w MFM for growth scans  3. History of cesarean  section X2, plan for repeat and BTL at 39 weeks  4. History of pre-eclampsia in prior pregnancy, currently pregnant On baby ASA  5. History of preterm delivery, currently pregnant At 7 months in [place]  6. Abnormal quad screen Elevated risk for Down, normal [place], declined amnio, s/p genetic counseling  Preterm labor symptoms and general obstetric precautions including but not limited to vaginal bleeding, contractions, leaking of fluid and fetal movement were reviewed in detail with the patient. Please refer to After Visit Summary for other counseling recommendations.  Return in 2 weeks (on 07/24/2020) for [facility], ob visit.   Venora Maples, MD

## 2020-07-10 NOTE — Patient Instructions (Signed)
 Eleccin del mtodo anticonceptivo Contraception Choices La anticoncepcin, o los mtodos anticonceptivos, hace referencia a los mtodos o dispositivos que evitan el embarazo. Mtodos hormonales Implante anticonceptivo Un implante anticonceptivo consiste en un tubo delgado de plstico que contiene una hormona que evita el embarazo. Es diferente de un dispositivo intrauterino (DIU). Un mdico lo inserta en la parte superior del brazo. Los implantes pueden ser eficaces durante un mximo de 3 aos. Inyecciones de progestina sola Las inyecciones de progestina sola contienen progestina, una forma sinttica de la hormona progesterona. Un mdico las administra cada 3 meses. Pldoras anticonceptivas Las pldoras anticonceptivas son pastillas que contienen hormonas que evitan el embarazo. Deben tomarse una vez al da, preferentemente a la misma hora cada da. Se necesita una receta para utilizar este mtodo anticonceptivo. Parche anticonceptivo El parche anticonceptivo contiene hormonas que evitan el embarazo. Se coloca en la piel, debe cambiarse una vez a la semana durante tres semanas y debe retirarse en la cuarta semana. Se necesita una receta para utilizar este mtodo anticonceptivo. Anillo vaginal Un anillo vaginal contiene hormonas que evitan el embarazo. Se coloca en la vagina durante tres semanas y se retira en la cuarta semana. Luego se repite el proceso con un anillo nuevo. Se necesita una receta para utilizar este mtodo anticonceptivo. Anticonceptivo de emergencia Los anticonceptivos de emergencia son mtodos para evitar un embarazo despus de tener sexo sin proteccin. Vienen en forma de pldora y pueden tomarse hasta 5 das despus de tener sexo. Funcionan mejor cuando se toman lo ms pronto posible luego de tener sexo. La mayora de los anticonceptivos de emergencia estn disponibles sin receta mdica. Este mtodo no debe utilizarse como el nico mtodo anticonceptivo.   Mtodos de  barrera Condn masculino Un condn masculino es una vaina delgada que se coloca sobre el pene durante el sexo. Los condones evitan que el esperma ingrese en el cuerpo de la mujer. Pueden utilizarse con un una sustancia que mata a los espermatozoides (espermicida) para aumentar la efectividad. Deben desecharse despus de un uso. Condn femenino Un condn femenino es una vaina blanda y holgada que se coloca en la vagina antes de tener sexo. El condn evita que el esperma ingrese en el cuerpo de la mujer. Deben desecharse despus de un uso. Diafragma Un diafragma es una barrera blanda con forma de cpula. Se inserta en la vagina antes del sexo, junto con un espermicida. El diafragma bloquea el ingreso de esperma en el tero, y el espermicida mata a los espermatozoides. El diafragma debe permanecer en la vagina durante 6 a 8 horas despus de tener sexo y debe retirarse en el plazo de las 24 horas. Un diafragma es recetado y colocado por un mdico. Debe reemplazarse cada 1 a 2 aos, despus de dar a luz, de aumentar ms de 15lb (6.8kg) y de una ciruga plvica. Capuchn cervical Un capuchn cervical es una copa redonda y blanda de ltex o plstico que se coloca en el cuello uterino. Se inserta en la vagina antes del sexo, junto con un espermicida. Bloquea el ingreso del esperma en el tero. El capuchn debe permanecer en el lugar durante 6 a 8 horas despus de tener sexo y debe retirarse en el plazo de las 48 horas. Un capuchn cervical debe ser recetado y colocado por un mdico. Debe reemplazarse cada 2aos. Esponja Una esponja es una pieza blanda y circular de espuma de poliuretano que contiene espermicida. La esponja ayuda a bloquear el ingreso de esperma en el tero, y el   espermicida mata a los espermatozoides. Para utilizarla, debe humedecerla e insertarla en la vagina. Debe insertarse antes de tener sexo, debe permanecer dentro al menos durante 6 horas despus de tener sexo y debe retirarse y  desecharse en el plazo de las 30 horas. Espermicidas Los espermicidas son sustancias qumicas que matan o bloquean al esperma y no lo dejan ingresar al cuello uterino y al tero. Vienen en forma de crema, gel, supositorio, espuma o comprimido. Un espermicida debe insertarse en la vagina con un aplicador al menos 10 o 15 minutos antes de tener sexo para dar tiempo a que surta efecto. El proceso debe repetirse cada vez que tenga sexo. Los espermicidas no requieren receta mdica.   Anticonceptivos intrauterinos Dispositivo intrauterino (DIU) Un DIU es un dispositivo en forma de T que se coloca en el tero. Existen dos tipos:  DIU hormonal.Este tipo contiene progestina, una forma sinttica de la hormona progesterona. Este tipo puede permanecer colocado durante 3 a 5 aos.  DIU de cobre.Este tipo est recubierto con un alambre de cobre. Puede permanecer colocado durante 10 aos. Mtodos anticonceptivos permanentes Ligadura de trompas en la mujer En este mtodo, se sellan, atan u obstruyen las trompas de Falopio durante una ciruga para evitar que el vulo descienda hacia el tero. Esterilizacin histeroscpica En este mtodo, se coloca un implante pequeo y flexible dentro de cada trompa de Falopio. Los implantes hacen que se forme un tejido cicatricial en las trompas de Falopio y que las obstruya para que el espermatozoide no pueda llegar al vulo. El procedimiento demora alrededor de 3 meses para que sea efectivo. Debe utilizarse otro mtodo anticonceptivo durante esos 3 meses. Esterilizacin masculina Este es un procedimiento que consiste en atar los conductos que transportan el esperma (vasectoma). Luego del procedimiento, el hombre puede eyacular lquido (semen). Debe utilizarse otro mtodo anticonceptivo durante 3 meses despus del procedimiento. Mtodos de planificacin natural Planificacin familiar natural En este mtodo, la pareja no tiene sexo durante los das en que la mujer podra quedar  embarazada. Mtodo calendario En este mtodo, la mujer realiza un seguimiento de la duracin de cada ciclo menstrual, identifica los das en los que se puede producir un embarazo y no tiene sexo durante esos das. Mtodo de la ovulacin En este mtodo, la pareja evita tener sexo durante la ovulacin. Mtodo sintotrmico Este mtodo implica no tener sexo durante la ovulacin. Normalmente, la mujer comprueba la ovulacin al observar cambios en su temperatura y en la consistencia del moco cervical. Mtodo posovulacin En este mtodo, la pareja espera a que finalice la ovulacin para tener sexo. Dnde buscar ms informacin  Centers for Disease Control and Prevention (Centros para el Control y la Prevencin de Enfermedades): www.cdc.gov Resumen  La anticoncepcin, o los mtodos anticonceptivos, hace referencia a los mtodos o dispositivos que evitan el embarazo.  Los mtodos anticonceptivos hormonales incluyen implantes, inyecciones, pastillas, parches, anillos vaginales y anticonceptivos de emergencia.  Los mtodos anticonceptivos de barrera pueden incluir condones masculinos, condones femeninos, diafragmas, capuchones cervicales, esponjas y espermicidas.  Existen dos tipos de DIU (dispositivo intrauterino). Un DIU puede colocarse en el tero de una mujer para evitar el embarazo durante 3 a 5 aos.  La esterilizacin permanente puede realizarse mediante un procedimiento tanto en los hombres como en las mujeres. Los mtodos de planificacin familiar natural implican no tener sexo durante los das en que la mujer podra quedar embarazada. Esta informacin no tiene como fin reemplazar el consejo del mdico. Asegrese de hacerle al mdico cualquier pregunta que   tenga. Document Revised: 11/01/2019 Document Reviewed: 11/01/2019 Elsevier Patient Education  2021 [employer] Inc.   Lactancia materna Breastfeeding  Decidir amamantar es una de las mejores elecciones que puede hacer por usted y su  beb. Un cambio en las hormonas durante el embarazo hace que las mamas produzcan leche materna en las glndulas productoras de leche. Las hormonas impiden que la leche materna sea liberada antes del nacimiento del beb. Adems, impulsan el flujo de leche luego del nacimiento. Una vez que ha comenzado a amamantar, pensar en el beb, as como la succin o el llanto, pueden estimular la liberacin de leche de las glndulas productoras de leche. Los beneficios de amamantar Las investigaciones demuestran que la lactancia materna ofrece muchos beneficios de salud para bebs y madres. Adems, ofrece una forma gratuita y conveniente de alimentar al beb. Para el beb  La primera leche (calostro) ayuda a mejorar el funcionamiento del aparato digestivo del beb.  Las clulas especiales de la leche (anticuerpos) ayudan a combatir las infecciones en el beb.  Los bebs que se alimentan con leche materna tambin tienen menos probabilidades de tener asma, alergias, obesidad o diabetes de tipo 2. Adems, tienen menor riesgo de sufrir el sndrome de muerte sbita del lactante (SMSL).  Los nutrientes de la leche materna son mejores para satisfacer las necesidades del beb en comparacin con la leche maternizada.  La leche materna mejora el desarrollo cerebral del beb. Para usted  La lactancia materna favorece el desarrollo de un vnculo muy especial entre la madre y el beb.  Es conveniente. La leche materna es econmica y siempre est disponible a la temperatura correcta.  La lactancia materna ayuda a quemar caloras. Le ayuda a perder el peso ganado durante el embarazo.  Hace que el tero vuelva al tamao que tena antes del embarazo ms rpido. Adems, disminuye el sangrado (loquios) despus del parto.  La lactancia materna contribuye a reducir el riesgo de tener diabetes de tipo 2, osteoporosis, artritis reumatoide, enfermedades cardiovasculares y cncer de mama, ovario, tero y endometrio en el  futuro. Informacin bsica sobre la lactancia Comienzo de la lactancia  Encuentre un lugar cmodo para sentarse o acostarse, con un buen respaldo para el cuello y la espalda.  Coloque una almohada o una manta enrollada debajo del beb para acomodarlo a la altura de la mama (si est sentada). Las almohadas para amamantar se han diseado especialmente a fin de servir de apoyo para los brazos y el beb mientras amamanta.  Asegrese de que la barriga del beb (abdomen) est frente a la suya.  Masajee suavemente la mama. Con las yemas de los dedos, masajee los bordes exteriores de la mama hacia adentro, en direccin al pezn. Esto estimula el flujo de leche. Si la leche fluye lentamente, es posible que deba continuar con este movimiento durante la lactancia.  Sostenga la mama con 4 dedos por debajo y el pulgar por arriba del pezn (forme la letra "C" con la mano). Asegrese de que los dedos se encuentren lejos del pezn y de la boca del beb.  Empuje suavemente los labios del beb con el pezn o con el dedo.  Cuando la boca del beb se abra lo suficiente, acrquelo rpidamente a la mama e introduzca todo el pezn y la arola, tanto como sea posible, dentro de la boca del beb. La arola es la zona de color que rodea al pezn. ? Debe haber ms arola visible por arriba del labio superior del beb que por debajo del labio   inferior. ? Los labios del beb deben estar abiertos y extendidos hacia afuera (evertidos) para asegurar que el beb se prenda de forma adecuada y cmoda. ? La lengua del beb debe estar entre la enca inferior y la mama.  Asegrese de que la boca del beb est en la posicin correcta alrededor del pezn (prendido). Los labios del beb deben crear un sello sobre la mama y estar doblados hacia afuera (invertidos).  Es comn que el beb succione durante 2 a 3 minutos para que comience el flujo de leche materna. Cmo debe prenderse Es muy importante que le ensee al beb cmo  prenderse adecuadamente a la mama. Si el beb no se prende adecuadamente, puede causar dolor en los pezones, reducir la produccin de leche materna y hacer que el beb tenga un escaso aumento de peso. Adems, si el beb no se prende adecuadamente al pezn, puede tragar aire durante la alimentacin. Esto puede causarle molestias al beb. Hacer eructar al beb al cambiar de mama puede ayudarlo a liberar el aire. Sin embargo, ensearle al beb cmo prenderse a la mama adecuadamente es la mejor manera de evitar que se sienta molesto por tragar aire mientras se alimenta. Signos de que el beb se ha prendido adecuadamente al pezn  Tironea o succiona de modo silencioso, sin causarle dolor. Los labios del beb deben estar extendidos hacia afuera (evertidos).  Se escucha que traga cada 3 o 4 succiones una vez que la leche ha comenzado a fluir (despus de que se produzca el reflejo de eyeccin de la leche).  Hay movimientos musculares por arriba y por delante de sus odos al succionar. Signos de que el beb no se ha prendido adecuadamente al pezn  Hace ruidos de succin o de chasquido mientras se alimenta.  Siente dolor en los pezones. Si cree que el beb no se prendi correctamente, deslice el dedo en la comisura de la boca y colquelo entre las encas del beb para interrumpir la succin. Intente volver a comenzar a amamantar. Signos de lactancia materna exitosa Signos del beb  El beb disminuir gradualmente el nmero de succiones o dejar de succionar por completo.  El beb se quedar dormido.  El cuerpo del beb se relajar.  El beb retendr una pequea cantidad de leche en la boca.  El beb se desprender solo del pecho. Signos que presenta usted  Las mamas han aumentado la firmeza, el peso y el tamao 1 a 3 horas despus de amamantar.  Estn ms blandas inmediatamente despus de amamantar.  Se producen un aumento del volumen de leche y un cambio en su consistencia y color hacia el  quinto da de lactancia.  Los pezones no duelen, no estn agrietados ni sangran. Signos de que su beb recibe la cantidad de leche suficiente  Mojar por lo menos 1 o 2paales durante las primeras 24horas despus del nacimiento.  Mojar por lo menos 5 o 6paales cada 24horas durante la primera semana despus del nacimiento. La orina debe ser clara o de color amarillo plido a los 5das de vida.  Mojar entre 6 y 8paales cada 24horas a medida que el beb sigue creciendo y desarrollndose.  Defeca por lo menos 3 veces en 24 horas a los 5 das de vida. Las heces deben ser blandas y amarillentas.  Defeca por lo menos 3 veces en 24 horas a los 7 das de vida. Las heces deben ser grumosas y amarillentas.  No registra una prdida de peso mayor al 10% del peso al   nacer durante los primeros 3 das de vida.  Aumenta de peso un promedio de 4 a 7onzas (113 a 198g) por semana despus de los 4 das de vida.  Aumenta de peso, diariamente, de manera uniforme a partir de los 5 das de vida, sin registrar prdida de peso despus de las 2semanas de vida. Despus de alimentarse, es posible que el beb regurgite una pequea cantidad de leche. Esto es normal. Frecuencia y duracin de la lactancia El amamantamiento frecuente la ayudar a producir ms leche y puede prevenir dolores en los pezones y las mamas extremadamente llenas (congestin mamaria). Alimente al beb cuando muestre signos de hambre o si siente la necesidad de reducir la congestin de las mamas. Esto se denomina "lactancia a demanda". Las seales de que el beb tiene hambre incluyen las siguientes:  Aumento del estado de alerta, actividad o inquietud.  Mueve la cabeza de un lado a otro.  Abre la boca cuando se le toca la mejilla o la comisura de la boca (reflejo de bsqueda).  Aumenta las vocalizaciones, tales como sonidos de succin, se relame los labios, emite arrullos, suspiros o chirridos.  Mueve la mano hacia la boca y se chupa  los dedos o las manos.  Est molesto o llora. Evite el uso del chupete en las primeras 4 a 6 semanas despus del nacimiento del beb. Despus de este perodo, podr usar un chupete. Las investigaciones demostraron que el uso del chupete durante el primer ao de vida del beb disminuye el riesgo de tener el sndrome de muerte sbita del lactante (SMSL). Permita que el nio se alimente en cada mama todo lo que desee. Cuando el beb se desprende o se queda dormido mientras se est alimentando de la primera mama, ofrzcale la segunda. Debido a que, con frecuencia, los recin nacidos estn somnolientos las primeras semanas de vida, es posible que deba despertar al beb para alimentarlo. Los horarios de lactancia varan de un beb a otro. Sin embargo, las siguientes reglas pueden servir como gua para ayudarla a garantizar que el beb se alimenta adecuadamente:  Se puede amamantar a los recin nacidos (bebs de 4 semanas o menos de vida) cada 1 a 3 horas.  No deben transcurrir ms de 3 horas durante el da o 5 horas durante la noche sin que se amamante a los recin nacidos.  Debe amamantar al beb un mnimo de 8 veces en un perodo de 24 horas. Extraccin de leche materna La extraccin y el almacenamiento de la leche materna le permiten asegurarse de que el beb se alimente exclusivamente de su leche materna, aun en momentos en los que no puede amamantar. Esto tiene especial importancia si debe regresar al trabajo en el perodo en que an est amamantando o si no puede estar presente en los momentos en que el beb debe alimentarse. Su asesor en lactancia puede ayudarla a encontrar un mtodo de extraccin que funcione mejor para usted y orientarla sobre cunto tiempo es seguro almacenar leche materna.      Cmo cuidar las mamas durante la lactancia Los pezones pueden secarse, agrietarse y doler durante la lactancia. Las siguientes recomendaciones pueden ayudarla a mantener las mamas humectadas y  sanas:  Evite usar jabn en los pezones.  Use un sostn de soporte diseado especialmente para la lactancia materna. Evite usar sostenes con aro o sostenes muy ajustados (sostenes deportivos).  Seque al aire sus pezones durante 3 a 4minutos despus de amamantar al beb.  Utilice solo apsitos de algodn en   el sostn para absorber las prdidas de leche. La prdida de un poco de leche materna entre las tomas es normal.  Utilice lanolina sobre los pezones luego de amamantar. La lanolina ayuda a mantener la humedad normal de la piel. La lanolina pura no es perjudicial (no es txica) para el beb. Adems, puede extraer manualmente algunas gotas de leche materna y masajear suavemente esa leche sobre los pezones para que la leche se seque al aire. Durante las primeras semanas despus del nacimiento, algunas mujeres experimentan congestin mamaria. La congestin mamaria puede hacer que sienta las mamas pesadas, calientes y sensibles al tacto. El pico de la congestin mamaria ocurre en el plazo de los 3 a 5 das despus del parto. Las siguientes recomendaciones pueden ayudarla a aliviar la congestin mamaria:  Vace por completo las mamas al amamantar o extraer leche. Puede aplicar calor hmedo en las mamas (en la ducha o con toallas hmedas para manos) antes de amamantar o extraer leche. Esto aumenta la circulacin y ayuda a que la leche fluya. Si el beb no vaca por completo las mamas cuando lo amamanta, extraiga la leche restante despus de que haya finalizado.  Aplique compresas de hielo sobre las mamas inmediatamente despus de amamantar o extraer leche, a menos que le resulte demasiado incmodo. Haga lo siguiente: ? Ponga el hielo en una bolsa plstica. ? Coloque una toalla entre la piel y la bolsa de hielo. ? Coloque el hielo durante 20minutos, 2 o 3veces por da.  Asegrese de que el beb est prendido y se encuentre en la posicin correcta mientras lo alimenta. Si la congestin mamaria  persiste luego de 48 horas o despus de seguir estas recomendaciones, comunquese con su mdico o un asesor en lactancia. Recomendaciones de salud general durante la lactancia  Consuma 3 comidas y 3 colaciones saludables todos los das. Las madres bien alimentadas que amamantan necesitan entre 450 y 500 caloras adicionales por da. Puede cumplir con este requisito al aumentar la cantidad de una dieta equilibrada que realice.  Beba suficiente agua para mantener la orina clara o de color amarillo plido.  Descanse con frecuencia, reljese y siga tomando sus vitaminas prenatales para prevenir la fatiga, el estrs y los niveles bajos de vitaminas y minerales en el cuerpo (deficiencias de nutrientes).  No consuma ningn producto que contenga nicotina o tabaco, como cigarrillos y cigarrillos electrnicos. El beb puede verse afectado por las sustancias qumicas de los cigarrillos que pasan a la leche materna y por la exposicin al humo ambiental del tabaco. Si necesita ayuda para dejar de fumar, consulte al mdico.  Evite el consumo de alcohol.  No consuma drogas ilegales o marihuana.  Antes de usar cualquier medicamento, hable con el mdico. Estos incluyen medicamentos recetados y de venta libre, como tambin vitaminas y suplementos a base de hierbas. Algunos medicamentos, que pueden ser perjudiciales para el beb, pueden pasar a travs de la leche materna.  Puede quedar embarazada durante la lactancia. Si se desea un mtodo anticonceptivo, consulte al mdico sobre cules son las opciones seguras durante la lactancia. Dnde encontrar ms informacin: Liga internacional La Leche: www.llli.org. Comunquese con un mdico si:  Siente que quiere dejar de amamantar o se siente frustrada con la lactancia.  Sus pezones estn agrietados o sangran.  Sus mamas estn irritadas, sensibles o calientes.  Tiene los siguientes sntomas: ? Dolor en las mamas o en los pezones. ? Un rea hinchada en cualquiera  de las mamas. ? Fiebre o escalofros. ? Nuseas o vmitos. ?   Drenaje de otro lquido distinto de la leche materna desde los pezones.  Sus mamas no se llenan antes de amamantar al beb para el quinto da despus del parto.  Se siente triste y deprimida.  El beb: ? Est demasiado somnoliento como para comer bien. ? Tiene problemas para dormir. ? Tiene ms de 1 semana de vida y moja menos de 6 paales en un periodo de 24 horas. ? No ha aumentado de peso a los 5 das de vida.  El beb defeca menos de 3 veces en 24 horas.  La piel del beb o las partes blancas de los ojos se vuelven amarillentas. Solicite ayuda de inmediato si:  El beb est muy cansado (letargo) y no se quiere despertar para comer.  Le sube la fiebre sin causa. Resumen  La lactancia materna ofrece muchos beneficios de salud para bebs y madres.  Intente amamantar a su beb cuando muestre signos tempranos de hambre.  Haga cosquillas o empuje suavemente los labios del beb con el dedo o el pezn para lograr que el beb abra la boca. Acerque el beb a la mama. Asegrese de que la mayor parte de la arola se encuentre dentro de la boca del beb. Ofrzcale una mama y haga eructar al beb antes de pasar a la otra.  Hable con su mdico o asesor en lactancia si tiene dudas o problemas con la lactancia. Esta informacin no tiene como fin reemplazar el consejo del mdico. Asegrese de hacerle al mdico cualquier pregunta que tenga. Document Revised: 06/25/2017 Document Reviewed: 07/21/2016 Elsevier Patient Education  2021 [employer] Inc.  

## 2020-07-12 ENCOUNTER — Other Ambulatory Visit: Payer: Self-pay

## 2020-07-12 ENCOUNTER — Ambulatory Visit: Payer: Self-pay | Admitting: Registered"

## 2020-07-12 ENCOUNTER — Encounter: Payer: Self-pay | Attending: Family Medicine | Admitting: Registered"

## 2020-07-12 ENCOUNTER — Other Ambulatory Visit: Payer: Self-pay | Admitting: *Deleted

## 2020-07-12 DIAGNOSIS — Z3A00 Weeks of gestation of pregnancy not specified: Secondary | ICD-10-CM | POA: Insufficient documentation

## 2020-07-12 DIAGNOSIS — O24419 Gestational diabetes mellitus in pregnancy, unspecified control: Secondary | ICD-10-CM | POA: Insufficient documentation

## 2020-07-12 NOTE — Progress Notes (Signed)
Interpreter services provided by Sherrie Mustache #[id number] from [facility] [employer]  Patient was seen on 07/12/20 for Gestational Diabetes self-management. EDD 09/17/20; [redacted]w[redacted]d . Patient states no history of GDM. Diet history obtained. Patient eats variety of all food groups. Beverages include milk, water.    The following learning objectives were met by the patient :   States the definition of Gestational Diabetes  States why dietary management is important in controlling blood glucose  Describes the effects of carbohydrates on blood glucose levels  Demonstrates ability to create a balanced meal plan  Demonstrates carbohydrate counting   States when to check blood glucose levels  Demonstrates proper blood glucose monitoring techniques  States the effect of stress and exercise on blood glucose levels  States the importance of limiting caffeine and abstaining from alcohol and smoking  Plan:  Aim for 3 Carbohydrate Choices per meal (45 grams) +/- 1 either way  Aim for 1-2 Carbohydrate Choices per snack Begin reading food labels for Total Carbohydrate of foods If OK with your MD, consider  increasing your activity level by walking, Arm Chair Exercises or other activity daily as tolerated Begin checking Blood Glucose before breakfast and 2 hours after first bite of breakfast, lunch and dinner as directed by MD  Bring Log Book/Sheet and meter to every medical appointment  Take medication if directed by MD  Blood glucose monitor given: [employer] Lot # [id number] CBG: 121 mg/dL (20 min after eating pupusa made with corn flower, beans, cheese and 10 oz milk)  Patient instructed to monitor glucose levels: FBS: 60 - 95 mg/dl 2 hour: <120 mg/dl  Patient received the following handouts:  Nutrition Diabetes and Pregnancy  Carbohydrate Counting List  Blood glucose Log Sheet  Patient will be seen for follow-up as needed.

## 2020-07-25 ENCOUNTER — Ambulatory Visit (INDEPENDENT_AMBULATORY_CARE_PROVIDER_SITE_OTHER): Payer: Self-pay | Admitting: Family Medicine

## 2020-07-25 ENCOUNTER — Other Ambulatory Visit: Payer: Self-pay

## 2020-07-25 ENCOUNTER — Encounter: Payer: Self-pay | Admitting: Family Medicine

## 2020-07-25 VITALS — BP 116/75 | HR 87 | Wt 187.6 lb

## 2020-07-25 DIAGNOSIS — O0990 Supervision of high risk pregnancy, unspecified, unspecified trimester: Secondary | ICD-10-CM

## 2020-07-25 DIAGNOSIS — O09299 Supervision of pregnancy with other poor reproductive or obstetric history, unspecified trimester: Secondary | ICD-10-CM

## 2020-07-25 DIAGNOSIS — Z98891 History of uterine scar from previous surgery: Secondary | ICD-10-CM

## 2020-07-25 DIAGNOSIS — O24410 Gestational diabetes mellitus in pregnancy, diet controlled: Secondary | ICD-10-CM

## 2020-07-25 DIAGNOSIS — O280 Abnormal hematological finding on antenatal screening of mother: Secondary | ICD-10-CM

## 2020-07-25 DIAGNOSIS — O09899 Supervision of other high risk pregnancies, unspecified trimester: Secondary | ICD-10-CM

## 2020-07-25 MED ORDER — METFORMIN HCL 1000 MG PO TABS: 1000.0000 mg | ORAL_TABLET | Freq: Two times a day (BID) | ORAL | 5 refills | Status: DC

## 2020-07-25 NOTE — Progress Notes (Signed)
   Subjective:  Kelli Robbins is a 35 y.o. [phone] at [redacted]w[redacted]d being seen today for ongoing prenatal care.  She is currently monitored for the following issues for this high-risk pregnancy and has Supervision of high risk pregnancy, antepartum; History of preterm delivery, currently pregnant; History of pre-eclampsia in prior pregnancy, currently pregnant; History of cesarean section; Abnormal quad screen; and Gestational diabetes mellitus on their problem list.  Patient reports no complaints.  Contractions: Not present. Vag. Bleeding: None.  Movement: Present. Denies leaking of fluid.   The following portions of the patient's history were reviewed and updated as appropriate: allergies, current medications, past family history, past medical history, past social history, past surgical history and problem list. Problem list updated.  Objective:   Vitals:   07/25/20 0857  BP: 116/75  Pulse: 87  Weight: 187 lb 9.6 oz (85.1 kg)    Fetal Status: Fetal Heart Rate (bpm): 135   Movement: Present     General:  Alert, oriented and cooperative. Patient is in no acute distress.  Skin: Skin is warm and dry. No rash noted.   Cardiovascular: Normal heart rate noted  Respiratory: Normal respiratory effort, no problems with respiration noted  Abdomen: Soft, gravid, appropriate for gestational age. Pain/Pressure: Absent     Pelvic: Vag. Bleeding: None     Cervical exam deferred        Extremities: Normal range of motion.  Edema: None  Mental Status: Normal mood and affect. Normal behavior. Normal judgment and thought content.   Urinalysis:      Assessment and Plan:  Pregnancy: G3P1102 at [redacted]w[redacted]d  1. Supervision of high risk pregnancy, antepartum BP and FHR normal Reports she called [facility] but was told she needed a referral placed before getting a prenatal appt Referral placed  2. Diet controlled gestational diabetes mellitus (GDM) in third trimester Sugars reviewed, only 60% at goal Almost  exclusively fasting and dinner post prandials out of range Admits to soda and sweet bread intake at night Counseled on diet, will start meformin 1g BID Start weekly BPP Following w MFM for growth scans  3. History of preterm delivery, currently pregnant [place] 7 months [place]  4. History of pre-eclampsia in prior pregnancy, currently pregnant On ASA  5. History of cesarean section X2, plan for repeat w BTL  6. Abnormal quad screen Increased risk for Down's, declined amnio, s/p genetic counseling, [place] have been normal  Preterm labor symptoms and general obstetric precautions including but not limited to vaginal bleeding, contractions, leaking of fluid and fetal movement were reviewed in detail with the patient. Please refer to After Visit Summary for other counseling recommendations.  Return in 2 weeks (on 08/08/2020) for [facility], ob visit.   Venora Maples, MD

## 2020-07-25 NOTE — Patient Instructions (Signed)
 https://www.nichd.nih.gov/health/topics/labor-delivery/Pages/default.aspx">  Tercer trimestre de embarazo Third Trimester of Pregnancy  El tercer trimestre de embarazo va desde la semana 28 hasta la semana 40. Esto corresponde a los meses 7 a 9. El tercer trimestre es un perodo en el que el beb en gestacin (feto) crece rpidamente. Hacia el final del noveno mes, el feto mide alrededor de 20pulgadas (45cm) de largo y pesa entre 6 y 10 libras (2.7 y 4.5kg). Cambios en el cuerpo durante el tercer trimestre Durante el tercer trimestre, su cuerpo contina experimentando numerosos cambios. Los cambios varan y generalmente vuelven a la normalidad despus del nacimiento del beb. Cambios fsicos  Seguir aumentando de peso. Es de esperar que aumente entre 25 y 35libras (11 y 16kg) hacia el final del embarazo si inicia el embarazo con un peso normal. Si tiene bajo peso, es de esperar que aumente entre 28 y 40 libras (13 y18 kg), y si tiene sobrepeso, es de esperar que aumente entre 15 y 25 libras (7 y 11kg).  Podrn aparecer las primeras estras en las caderas, el abdomen y las mamas.  Las mamas seguirn creciendo y pueden doler. Un lquido amarillo (calostro) puede salir de sus pechos. Esta es la primera leche que usted produce para su beb.  Tal vez haya cambios en el cabello. Esto cambios pueden incluir su engrosamiento, crecimiento rpido y cambios en la textura. A algunas personas tambin se les cae el cabello durante o despus del embarazo, o tienen el cabello seco o fino.  El ombligo puede salir hacia afuera.  Puede observar que se le hinchan las manos, el rostro o los tobillos. Cambios en la salud  Es posible que tenga acidez estomacal.  Puede sufrir estreimiento.  Puede desarrollar hemorroides.  Puede desarrollar venas hinchadas y abultadas (venas varicosas) en las piernas.  Puede presentar ms dolor en la pelvis, la espalda o los muslos. Esto se debe al aumento de peso  y al aumento de las hormonas que relajan las articulaciones.  Puede presentar un aumento del hormigueo o entumecimiento en las manos, brazos y piernas. La piel de su abdomen tambin puede sentirse entumecida.  Puede sentir que le falta el aire debido a que se expande el tero. Otros cambios  Puede tener necesidad de orinar con ms frecuencia porque el feto baja hacia la pelvis y ejerce presin sobre la vejiga.  Puede tener ms problemas para dormir. Esto puede deberse al tamao de su abdomen, una mayor necesidad de orinar y un aumento en el metabolismo de su cuerpo.  Puede notar que el feto "baja" o lo siente ms bajo, en el abdomen (aligeramiento).  Puede tener un aumento de la secrecin vaginal.  Puede notar que tiene dolor alrededor del hueso plvico a medida que el tero se distiende. Siga estas instrucciones en su casa: Medicamentos  Siga las instrucciones del mdico en relacin con el uso de medicamentos. Durante el embarazo, hay medicamentos que pueden tomarse y otros que no. No tome ningn medicamento a menos que lo haya autorizado el mdico.  Tome vitaminas prenatales que contengan por lo menos 600microgramos (mcg) de cido flico. Comida y bebida  Lleve una dieta saludable que incluya frutas y verduras frescas, cereales integrales, buenas fuentes de protenas como carnes magras, huevos o tofu, y productos lcteos descremados.  Evite la carne cruda y el jugo, la leche y el queso sin pasteurizar. Estos portan grmenes que pueden provocar dao tanto a usted como al beb.  Tome 4 o 5 comidas pequeas en lugar de   3 comidas abundantes al da.  Es posible que tenga que tomar estas medidas para prevenir o tratar el estreimiento: ? Beber suficiente lquido como para mantener la orina de color amarillo plido. ? Consumir alimentos ricos en fibra, como frijoles, cereales integrales, y frutas y verduras frescas. ? Limitar el consumo de alimentos ricos en grasa y azcares procesados,  como los alimentos fritos o dulces. Actividad  Haga ejercicio solamente como se lo haya indicado el mdico. La mayora de las personas pueden continuar su actividad fsica habitual durante el embarazo. Intente realizar como mnimo 30minutos de actividad fsica por lo menos 5das a la semana. Deje de hacer ejercicio si experimenta contracciones en el tero.  Deje de hacer ejercicio si le aparecen dolor o clicos en la parte baja del vientre o de la espalda.  Evite levantar pesos excesivos.  No haga ejercicio si hace mucho calor o humedad, o si se encuentra a una altitud elevada.  Si lo desea, puede seguir teniendo relaciones sexuales, salvo que el mdico le indique lo contrario. Alivio del dolor y del malestar  Haga pausas frecuentes y descanse con las piernas levantadas (elevadas) si tiene calambres en las piernas o dolor en la parte baja de la espalda.  Dese baos de asiento con agua tibia para aliviar el dolor o las molestias causadas por las hemorroides. Use una crema para las hemorroides si el mdico la autoriza.  Use un sujetador que le brinde buen soporte para prevenir las molestias causadas por la sensibilidad en las mamas.  Si tiene venas varicosas: ? Use medias de compresin como se lo haya indicado el mdico. ? Eleve los pies durante 15minutos, 3 o 4veces por da. ? Limite el consumo de sal en su dieta. Seguridad  Hable con su mdico antes de viajar distancias largas.  No se d baos de inmersin en agua caliente, baos turcos ni saunas.  Use el cinturn de seguridad en todo momento mientras conduce o va en auto.  Hable con el mdico si es vctima de maltrato verbal o fsico. Preparacin para el nacimiento Para prepararse para la llegada de su beb:  Tome clases prenatales para entender, practicar, y hacer preguntas sobre el trabajo de parto y el parto.  Visite el hospital y recorra el rea de maternidad.  Compre un asiento de seguridad orientado hacia atrs, y  asegrese de saber cmo instalarlo en su automvil.  Prepare la habitacin o el lugar donde dormir el beb. Asegrese de quitar todas las almohadas y animales de peluche de la cuna del beb para evitar la asfixia. Indicaciones generales  Evite el contacto con las bandejas sanitarias de los gatos y la tierra que estos animales usan. Estos alimentos contienen bacterias que pueden causar defectos congnitos en el beb. Si tiene un gato, pdale a alguien que limpie la caja de arena por usted.  No se haga lavados vaginales ni use tampones. No use toallas higinicas perfumadas.  No consuma ningn producto que contenga nicotina o tabaco, como cigarrillos, cigarrillos electrnicos y tabaco de mascar. Si necesita ayuda para dejar de consumir estos productos, consulte al mdico.  No use ningn remedio a base de hierbas, drogas ilegales o medicamentos que no le hayan sido recetados. Las sustancias qumicas de estos productos pueden daar al beb.  No beba alcohol.  Le realizarn exmenes prenatales ms frecuentes durante el tercer trimestre. Durante una visita prenatal de rutina, el mdico le har un examen fsico, le realizar pruebas y hablar con usted de su salud general. Cumpla   con todas las visitas de seguimiento. Esto es importante. Dnde buscar ms informacin  American Pregnancy Association (Asociacin Estadounidense del Embarazo): americanpregnancy.org  American College of Obstetricians and Gynecologists (Colegio Estadounidense de Obstetras y Gineclogos): acog.org/womens-health/pregnancy?  Office on Women's Health (Oficina para la Salud de la Mujer): womenshealth.gov/pregnancy Comunquese con un mdico si tiene:  Fiebre.  Clicos leves en la pelvis, presin en la pelvis o dolor persistente en la zona abdominal o la parte baja de la espalda.  Vmitos o diarrea.  Secrecin vaginal con mal olor u orina con mal olor.  Dolor al orinar.  Un dolor de cabeza que no desaparece despus de  tomar analgsicos.  Cambios en la visin o ve manchas delante de los ojos. Solicite ayuda de inmediato si:  Rompe la bolsa.  Tiene contracciones regulares separadas por menos de 5minutos.  Tiene sangrado o pequeas prdidas vaginales.  Siente un dolor abdominal intenso.  Tiene dificultad para respirar.  Siente dolor en el pecho.  Sufre episodios de desmayo.  No ha sentido a su beb moverse durante el perodo de tiempo que le indic el mdico.  Tiene dolor, hinchazn o enrojecimiento nuevos en un brazo o una pierna o se produce un aumento de alguno de estos sntomas. Resumen  El tercer trimestre del embarazo comprende desde la semana28 hasta la semana 40 (desde el mes7 hasta el mes9).  Puede tener ms problemas para dormir. Esto puede deberse al tamao de su abdomen, una mayor necesidad de orinar y un aumento en el metabolismo de su cuerpo.  Le realizarn exmenes prenatales ms frecuentes durante el tercer trimestre. Cumpla con todas las visitas de seguimiento. Esto es importante. Esta informacin no tiene como fin reemplazar el consejo del mdico. Asegrese de hacerle al mdico cualquier pregunta que tenga. Document Revised: 10/07/2019 Document Reviewed: 10/07/2019 Elsevier Patient Education  2021 Elsevier Inc.   Eleccin del mtodo anticonceptivo Contraception Choices La anticoncepcin, o los mtodos anticonceptivos, hace referencia a los mtodos o dispositivos que evitan el embarazo. Mtodos hormonales Implante anticonceptivo Un implante anticonceptivo consiste en un tubo delgado de plstico que contiene una hormona que evita el embarazo. Es diferente de un dispositivo intrauterino (DIU). Un mdico lo inserta en la parte superior del brazo. Los implantes pueden ser eficaces durante un mximo de 3 aos. Inyecciones de progestina sola Las inyecciones de progestina sola contienen progestina, una forma sinttica de la hormona progesterona. Un mdico las administra cada 3  meses. Pldoras anticonceptivas Las pldoras anticonceptivas son pastillas que contienen hormonas que evitan el embarazo. Deben tomarse una vez al da, preferentemente a la misma hora cada da. Se necesita una receta para utilizar este mtodo anticonceptivo. Parche anticonceptivo El parche anticonceptivo contiene hormonas que evitan el embarazo. Se coloca en la piel, debe cambiarse una vez a la semana durante tres semanas y debe retirarse en la cuarta semana. Se necesita una receta para utilizar este mtodo anticonceptivo. Anillo vaginal Un anillo vaginal contiene hormonas que evitan el embarazo. Se coloca en la vagina durante tres semanas y se retira en la cuarta semana. Luego se repite el proceso con un anillo nuevo. Se necesita una receta para utilizar este mtodo anticonceptivo. Anticonceptivo de emergencia Los anticonceptivos de emergencia son mtodos para evitar un embarazo despus de tener sexo sin proteccin. Vienen en forma de pldora y pueden tomarse hasta 5 das despus de tener sexo. Funcionan mejor cuando se toman lo ms pronto posible luego de tener sexo. La mayora de los anticonceptivos de emergencia estn disponibles sin receta mdica. Este   mtodo no debe utilizarse como el nico mtodo anticonceptivo.   Mtodos de barrera Condn masculino Un condn masculino es una vaina delgada que se coloca sobre el pene durante el sexo. Los condones evitan que el esperma ingrese en el cuerpo de la mujer. Pueden utilizarse con un una sustancia que mata a los espermatozoides (espermicida) para aumentar la efectividad. Deben desecharse despus de un uso. Condn femenino Un condn femenino es una vaina blanda y holgada que se coloca en la vagina antes de tener sexo. El condn evita que el esperma ingrese en el cuerpo de la mujer. Deben desecharse despus de un uso. Diafragma Un diafragma es una barrera blanda con forma de cpula. Se inserta en la vagina antes del sexo, junto con un espermicida. El  diafragma bloquea el ingreso de esperma en el tero, y el espermicida mata a los espermatozoides. El diafragma debe permanecer en la vagina durante 6 a 8 horas despus de tener sexo y debe retirarse en el plazo de las 24 horas. Un diafragma es recetado y colocado por un mdico. Debe reemplazarse cada 1 a 2 aos, despus de dar a luz, de aumentar ms de 15lb (6.8kg) y de una ciruga plvica. Capuchn cervical Un capuchn cervical es una copa redonda y blanda de ltex o plstico que se coloca en el cuello uterino. Se inserta en la vagina antes del sexo, junto con un espermicida. Bloquea el ingreso del esperma en el tero. El capuchn debe permanecer en el lugar durante 6 a 8 horas despus de tener sexo y debe retirarse en el plazo de las 48 horas. Un capuchn cervical debe ser recetado y colocado por un mdico. Debe reemplazarse cada 2aos. Esponja Una esponja es una pieza blanda y circular de espuma de poliuretano que contiene espermicida. La esponja ayuda a bloquear el ingreso de esperma en el tero, y el espermicida mata a los espermatozoides. Para utilizarla, debe humedecerla e insertarla en la vagina. Debe insertarse antes de tener sexo, debe permanecer dentro al menos durante 6 horas despus de tener sexo y debe retirarse y desecharse en el plazo de las 30 horas. Espermicidas Los espermicidas son sustancias qumicas que matan o bloquean al esperma y no lo dejan ingresar al cuello uterino y al tero. Vienen en forma de crema, gel, supositorio, espuma o comprimido. Un espermicida debe insertarse en la vagina con un aplicador al menos 10 o 15 minutos antes de tener sexo para dar tiempo a que surta efecto. El proceso debe repetirse cada vez que tenga sexo. Los espermicidas no requieren receta mdica.   Anticonceptivos intrauterinos Dispositivo intrauterino (DIU) Un DIU es un dispositivo en forma de T que se coloca en el tero. Existen dos tipos:  DIU hormonal.Este tipo contiene progestina, una forma  sinttica de la hormona progesterona. Este tipo puede permanecer colocado durante 3 a 5 aos.  DIU de cobre.Este tipo est recubierto con un alambre de cobre. Puede permanecer colocado durante 10 aos. Mtodos anticonceptivos permanentes Ligadura de trompas en la mujer En este mtodo, se sellan, atan u obstruyen las trompas de Falopio durante una ciruga para evitar que el vulo descienda hacia el tero. Esterilizacin histeroscpica En este mtodo, se coloca un implante pequeo y flexible dentro de cada trompa de Falopio. Los implantes hacen que se forme un tejido cicatricial en las trompas de Falopio y que las obstruya para que el espermatozoide no pueda llegar al vulo. El procedimiento demora alrededor de 3 meses para que sea efectivo. Debe utilizarse otro mtodo anticonceptivo durante   esos 3 meses. Esterilizacin masculina Este es un procedimiento que consiste en atar los conductos que transportan el esperma (vasectoma). Luego del procedimiento, el hombre puede eyacular lquido (semen). Debe utilizarse otro mtodo anticonceptivo durante 3 meses despus del procedimiento. Mtodos de planificacin natural Planificacin familiar natural En este mtodo, la pareja no tiene sexo durante los das en que la mujer podra quedar embarazada. Mtodo calendario En este mtodo, la mujer realiza un seguimiento de la duracin de cada ciclo menstrual, identifica los das en los que se puede producir un embarazo y no tiene sexo durante esos das. Mtodo de la ovulacin En este mtodo, la pareja evita tener sexo durante la ovulacin. Mtodo sintotrmico Este mtodo implica no tener sexo durante la ovulacin. Normalmente, la mujer comprueba la ovulacin al observar cambios en su temperatura y en la consistencia del moco cervical. Mtodo posovulacin En este mtodo, la pareja espera a que finalice la ovulacin para tener sexo. Dnde buscar ms informacin  Centers for Disease Control and Prevention (Centros  para el Control y la Prevencin de Enfermedades): www.cdc.gov Resumen  La anticoncepcin, o los mtodos anticonceptivos, hace referencia a los mtodos o dispositivos que evitan el embarazo.  Los mtodos anticonceptivos hormonales incluyen implantes, inyecciones, pastillas, parches, anillos vaginales y anticonceptivos de emergencia.  Los mtodos anticonceptivos de barrera pueden incluir condones masculinos, condones femeninos, diafragmas, capuchones cervicales, esponjas y espermicidas.  Existen dos tipos de DIU (dispositivo intrauterino). Un DIU puede colocarse en el tero de una mujer para evitar el embarazo durante 3 a 5 aos.  La esterilizacin permanente puede realizarse mediante un procedimiento tanto en los hombres como en las mujeres. Los mtodos de planificacin familiar natural implican no tener sexo durante los das en que la mujer podra quedar embarazada. Esta informacin no tiene como fin reemplazar el consejo del mdico. Asegrese de hacerle al mdico cualquier pregunta que tenga. Document Revised: 11/01/2019 Document Reviewed: 11/01/2019 Elsevier Patient Education  2021 Elsevier Inc.  

## 2020-07-26 ENCOUNTER — Encounter: Payer: Self-pay | Attending: Family Medicine | Admitting: Registered"

## 2020-07-26 ENCOUNTER — Ambulatory Visit: Payer: Self-pay | Admitting: Registered"

## 2020-07-26 DIAGNOSIS — O24419 Gestational diabetes mellitus in pregnancy, unspecified control: Secondary | ICD-10-CM | POA: Insufficient documentation

## 2020-07-26 DIAGNOSIS — Z3A00 Weeks of gestation of pregnancy not specified: Secondary | ICD-10-CM | POA: Insufficient documentation

## 2020-07-26 NOTE — Progress Notes (Signed)
Interpreter services provided by Adela Lank [phone] Steward Drone [phone] second half of visit) from [employer].  Patient was seen on 07/26/20 for follow-up assessment and education for Gestational Diabetes. EDD 09/17/20; [redacted]w[redacted]d.   Pt states she has stopped drinking soda and eating sweet bread at night. 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 RD discussed strategies to address.  Patient started metformin 1000 mg bid.   The following learning objectives reviewed during follow-up visit:   Situations that create carb cravings  Alternative ways to feel better after work  Risk of T2DM and importance to sustain changes after delivery to reduce risk  Plan:  . Ask for help at home if needed to help you relax after work. . Do things you enjoy for self-care . Continue what you are eating for breakfast and lunch and be sure to not restrict too much.   Patient instructed to monitor glucose levels: FBS: 60 - 95 mg/dl 2 hour: <277 mg/dl  Patient received the following handouts:  none  Patient will be seen for follow-up in 2 weeks or as needed.

## 2020-07-31 ENCOUNTER — Other Ambulatory Visit: Payer: Self-pay | Admitting: Lactation Services

## 2020-07-31 DIAGNOSIS — O0990 Supervision of high risk pregnancy, unspecified, unspecified trimester: Secondary | ICD-10-CM

## 2020-07-31 NOTE — Progress Notes (Signed)
Placed Prenatal referral for Pediatrics to [facility] at Patients request per Dr. Audie Clear note on 4/13.

## 2020-08-01 ENCOUNTER — Other Ambulatory Visit: Payer: Self-pay

## 2020-08-02 ENCOUNTER — Other Ambulatory Visit: Payer: Self-pay

## 2020-08-02 ENCOUNTER — Ambulatory Visit (INDEPENDENT_AMBULATORY_CARE_PROVIDER_SITE_OTHER): Payer: Self-pay

## 2020-08-02 ENCOUNTER — Ambulatory Visit: Payer: Self-pay | Admitting: *Deleted

## 2020-08-02 VITALS — BP 126/72 | HR 90 | Wt 184.5 lb

## 2020-08-02 DIAGNOSIS — O24415 Gestational diabetes mellitus in pregnancy, controlled by oral hypoglycemic drugs: Secondary | ICD-10-CM

## 2020-08-02 NOTE — Progress Notes (Signed)
Pt informed that the ultrasound is considered a limited OB ultrasound and is not intended to be a complete ultrasound exam.  Patient also informed that the ultrasound is not being completed with the intent of assessing for fetal or placental anomalies or any pelvic abnormalities.  Explained that the purpose of today's ultrasound is to assess for presentation, BPP and amniotic fluid volume  Patient acknowledges the purpose of the exam and the limitations of the study.  Interpreter Eda Royal present for encounter.

## 2020-08-02 NOTE — Progress Notes (Addendum)
  NST:  Baseline: 130 bpm, Variability: Good {> 6 bpm), Accelerations: Reactive and Decelerations: Absent  BPP 8/8 - see AS

## 2020-08-07 ENCOUNTER — Ambulatory Visit: Payer: Self-pay | Admitting: Registered"

## 2020-08-07 ENCOUNTER — Encounter: Payer: Self-pay | Admitting: Registered"

## 2020-08-07 DIAGNOSIS — O24419 Gestational diabetes mellitus in pregnancy, unspecified control: Secondary | ICD-10-CM

## 2020-08-07 NOTE — Progress Notes (Signed)
Interpreter services provided by Gean Maidens [phone] from [employer] video  Patient was seen on 08/07/20 for follow-up assessment and education for Gestational Diabetes. EDD 09/17/20; [redacted]w[redacted]d. Patient states changes to diet/lifestyle including has stopped eating sweet bread after work and reduced amount of carbohydrates with dinner.    Patient was concerned that her meter wasn't working because she would get very different numbers, could be more than 50 pt difference. Patient had run out of strips so RD was not able to see if the test strips were contaminated. RD provided a new bottle of strips and tested blood glucose 3x; 95, 96, 101 mg/dL all close enough to appear that her meter is working. Patient was told to contact RD again if she finds it starts happening often. Once in a while is not to worry about.  Patient had a container of alcohol wipes that does not seal after getting each wipe.  The following learning objectives reviewed during follow-up visit:   Potential situations that could cause erroneous glucometer readings  Appropriate amount of egg consumption  Plan:  . Do not panic if you see a higher than expected number, just retest. Let me know if it happens frequently with the new bottle of strips . Be sure to wash hands well with soap and water and that they are dry before pricking finger . Look for individually packaged alcohol wipes . Eating more than one egg for breakfast if fine if you are still hungry  Patient instructed to monitor glucose levels: FBS: 60 - 95 mg/dl 2 hour: <643 mg/dl  Patient received the following handouts: none  Patient will be seen for follow-up as needed.

## 2020-08-08 ENCOUNTER — Ambulatory Visit: Payer: Self-pay | Admitting: *Deleted

## 2020-08-08 ENCOUNTER — Other Ambulatory Visit: Payer: Self-pay | Admitting: Obstetrics and Gynecology

## 2020-08-08 ENCOUNTER — Encounter: Payer: Self-pay | Admitting: *Deleted

## 2020-08-08 ENCOUNTER — Encounter: Payer: Self-pay | Admitting: Obstetrics and Gynecology

## 2020-08-08 ENCOUNTER — Other Ambulatory Visit: Payer: Self-pay

## 2020-08-08 ENCOUNTER — Ambulatory Visit: Payer: Self-pay | Attending: Obstetrics and Gynecology

## 2020-08-08 ENCOUNTER — Other Ambulatory Visit: Payer: Self-pay | Admitting: *Deleted

## 2020-08-08 ENCOUNTER — Ambulatory Visit (INDEPENDENT_AMBULATORY_CARE_PROVIDER_SITE_OTHER): Payer: Self-pay | Admitting: Obstetrics and Gynecology

## 2020-08-08 VITALS — BP 116/76 | HR 87 | Wt 185.4 lb

## 2020-08-08 DIAGNOSIS — O09299 Supervision of pregnancy with other poor reproductive or obstetric history, unspecified trimester: Secondary | ICD-10-CM | POA: Insufficient documentation

## 2020-08-08 DIAGNOSIS — O0990 Supervision of high risk pregnancy, unspecified, unspecified trimester: Secondary | ICD-10-CM | POA: Insufficient documentation

## 2020-08-08 DIAGNOSIS — O24415 Gestational diabetes mellitus in pregnancy, controlled by oral hypoglycemic drugs: Secondary | ICD-10-CM

## 2020-08-08 DIAGNOSIS — O09523 Supervision of elderly multigravida, third trimester: Secondary | ICD-10-CM

## 2020-08-08 DIAGNOSIS — Z8759 Personal history of other complications of pregnancy, childbirth and the puerperium: Secondary | ICD-10-CM

## 2020-08-08 DIAGNOSIS — O09899 Supervision of other high risk pregnancies, unspecified trimester: Secondary | ICD-10-CM | POA: Insufficient documentation

## 2020-08-08 DIAGNOSIS — O280 Abnormal hematological finding on antenatal screening of mother: Secondary | ICD-10-CM | POA: Insufficient documentation

## 2020-08-08 DIAGNOSIS — E669 Obesity, unspecified: Secondary | ICD-10-CM

## 2020-08-08 DIAGNOSIS — Z98891 History of uterine scar from previous surgery: Secondary | ICD-10-CM

## 2020-08-08 DIAGNOSIS — Z3A34 34 weeks gestation of pregnancy: Secondary | ICD-10-CM

## 2020-08-08 DIAGNOSIS — O99213 Obesity complicating pregnancy, third trimester: Secondary | ICD-10-CM

## 2020-08-08 DIAGNOSIS — O09213 Supervision of pregnancy with history of pre-term labor, third trimester: Secondary | ICD-10-CM

## 2020-08-08 DIAGNOSIS — O34219 Maternal care for unspecified type scar from previous cesarean delivery: Secondary | ICD-10-CM

## 2020-08-08 DIAGNOSIS — Z363 Encounter for antenatal screening for malformations: Secondary | ICD-10-CM

## 2020-08-08 NOTE — Progress Notes (Signed)
   PRENATAL VISIT NOTE  Subjective:  Kelli Robbins is a 35 y.o. [phone] at [redacted]w[redacted]d being seen today for ongoing prenatal care.  She is currently monitored for the following issues for this high-risk pregnancy and has Supervision of high risk pregnancy, antepartum; History of preterm delivery, currently pregnant; History of pre-eclampsia in prior pregnancy, currently pregnant; History of cesarean section; Abnormal quad screen; and Gestational diabetes mellitus on their problem list.  Patient reports no complaints.  Contractions: Not present. Vag. Bleeding: None.  Movement: Present. Denies leaking of fluid.   The following portions of the patient's history were reviewed and updated as appropriate: allergies, current medications, past family history, past medical history, past social history, past surgical history and problem list.   Objective:   Vitals:   08/08/20 1005  BP: 116/76  Pulse: 87  Weight: 185 lb 6.4 oz (84.1 kg)    Fetal Status: Fetal Heart Rate (bpm): 136 Fundal Height: 34 cm Movement: Present     General:  Alert, oriented and cooperative. Patient is in no acute distress.  Skin: Skin is warm and dry. No rash noted.   Cardiovascular: Normal heart rate noted  Respiratory: Normal respiratory effort, no problems with respiration noted  Abdomen: Soft, gravid, appropriate for gestational age.  Pain/Pressure: Absent     Pelvic: Cervical exam deferred        Extremities: Normal range of motion.  Edema: None  Mental Status: Normal mood and affect. Normal behavior. Normal judgment and thought content.   Assessment and Plan:  Pregnancy: G3P1102 at [redacted]w[redacted]d 1. Supervision of high risk pregnancy, antepartum Patient is doing well without complaints  2. Gestational diabetes mellitus (GDM) in third trimester controlled on oral hypoglycemic drug CBGs reviewed and fasting elevated 92-100 pp all within range.  Advised patient to consume a protein rich snack at bedtime Continue metformin  1000 mg BID Normal growth and BPP today Continue weekly antenatal testing  3. History of cesarean section Scheduled for repeat  4. History of preterm delivery, currently pregnant   5. History of pre-eclampsia in prior pregnancy, currently pregnant Asymptomatic and normotensive  Preterm labor symptoms and general obstetric precautions including but not limited to vaginal bleeding, contractions, leaking of fluid and fetal movement were reviewed in detail with the patient. Please refer to After Visit Summary for other counseling recommendations.   No follow-ups on file.  Future Appointments  Date Time Provider Department Center  08/08/2020 10:55 AM Georgeanne Frankland, Gigi Gin, MD [facility]  08/15/2020  2:15 PM [facility] NST [facility]  08/15/2020  3:15 PM Venora Maples, MD [facility]  08/29/2020  8:30 AM [facility] NURSE [facility]  08/29/2020  8:45 AM [facility] US5 [facility]    Catalina Antigua, MD

## 2020-08-14 ENCOUNTER — Emergency Department (HOSPITAL_COMMUNITY): Payer: Self-pay

## 2020-08-14 ENCOUNTER — Emergency Department (HOSPITAL_COMMUNITY)
Admission: EM | Admit: 2020-08-14 | Discharge: 2020-08-15 | Disposition: A | Discharge status: Home / Self Care | Payer: Self-pay | Attending: Emergency Medicine | Admitting: Emergency Medicine

## 2020-08-14 DIAGNOSIS — O9A213 Injury, poisoning and certain other consequences of external causes complicating pregnancy, third trimester: Secondary | ICD-10-CM | POA: Insufficient documentation

## 2020-08-14 DIAGNOSIS — Y990 Civilian activity done for income or pay: Secondary | ICD-10-CM | POA: Insufficient documentation

## 2020-08-14 DIAGNOSIS — Z3A00 Weeks of gestation of pregnancy not specified: Secondary | ICD-10-CM | POA: Insufficient documentation

## 2020-08-14 DIAGNOSIS — Z7982 Long term (current) use of aspirin: Secondary | ICD-10-CM | POA: Insufficient documentation

## 2020-08-14 DIAGNOSIS — X509XXA Other and unspecified overexertion or strenuous movements or postures, initial encounter: Secondary | ICD-10-CM | POA: Insufficient documentation

## 2020-08-14 DIAGNOSIS — S93402A Sprain of unspecified ligament of left ankle, initial encounter: Secondary | ICD-10-CM | POA: Insufficient documentation

## 2020-08-14 DIAGNOSIS — O133 Gestational [pregnancy-induced] hypertension without significant proteinuria, third trimester: Secondary | ICD-10-CM | POA: Insufficient documentation

## 2020-08-14 NOTE — ED Provider Notes (Signed)
Emergency Medicine Provider Triage Evaluation Note  Kelli Robbins , a 35 y.o. female  was evaluated in triage.  Pt complains of pain in the left foot and ankle.  She reports that she twisted the foot this morning at work.  She is 8 months pregnant.  She denies any injury other than the left foot and ankle.  Didn't strike her belly.  No bleeding, spotting, or loss of fluids. She denies any OB/GYN concerns today.    Professional spanish interpreter used.  Review of Systems  Positive: Left ankle pain Negative: Vaginal bleeding, spotting  Physical Exam  BP 114/74 (BP Location: Left Arm)   Pulse 81   Temp 98.6 F (37 C) (Oral)   Resp 16   LMP 12/12/2019   SpO2 99%  Gen:   Awake, no distress   Resp:  Normal effort  MSK:   Moves extremities without difficulty, LLE with TTP and obvious edema around left foot and ankle. No proximal lower leg TTP.  Other:  Abd: obviously gravid  Medical Decision Making  Medically screening exam initiated at 8:37 PM.  Appropriate orders placed.  Kelli Robbins was informed that the remainder of the evaluation will be completed by another provider, this initial triage assessment does not replace that evaluation, and the importance of remaining in the ED until their evaluation is complete.  At this time patient does not appear to qualify for transfer to [facility] as she is not here for any pregnancy related concerns.    Norman Clay 08/14/20 2039    Arby Barrette, MD 08/15/20 [phone]

## 2020-08-14 NOTE — ED Triage Notes (Addendum)
Pt reports twisted her left foot  this morning at works she did not fall or hit her stomach, pt denies any pain in stomach or vaginal bleeding. Pt states she is schedule for a c-section May 30

## 2020-08-15 ENCOUNTER — Ambulatory Visit: Payer: Self-pay | Admitting: Registered"

## 2020-08-15 ENCOUNTER — Ambulatory Visit (INDEPENDENT_AMBULATORY_CARE_PROVIDER_SITE_OTHER): Payer: Self-pay | Admitting: Family Medicine

## 2020-08-15 ENCOUNTER — Ambulatory Visit (INDEPENDENT_AMBULATORY_CARE_PROVIDER_SITE_OTHER): Payer: Self-pay

## 2020-08-15 ENCOUNTER — Encounter: Payer: Self-pay | Attending: Family Medicine | Admitting: Registered"

## 2020-08-15 ENCOUNTER — Encounter: Payer: Self-pay | Admitting: Family Medicine

## 2020-08-15 ENCOUNTER — Other Ambulatory Visit: Payer: Self-pay

## 2020-08-15 ENCOUNTER — Ambulatory Visit: Payer: Self-pay | Admitting: *Deleted

## 2020-08-15 VITALS — BP 126/70 | HR 94 | Wt 188.0 lb

## 2020-08-15 DIAGNOSIS — Z5941 Food insecurity: Secondary | ICD-10-CM

## 2020-08-15 DIAGNOSIS — O24419 Gestational diabetes mellitus in pregnancy, unspecified control: Secondary | ICD-10-CM

## 2020-08-15 DIAGNOSIS — O0990 Supervision of high risk pregnancy, unspecified, unspecified trimester: Secondary | ICD-10-CM

## 2020-08-15 DIAGNOSIS — Z3A00 Weeks of gestation of pregnancy not specified: Secondary | ICD-10-CM | POA: Insufficient documentation

## 2020-08-15 DIAGNOSIS — O24415 Gestational diabetes mellitus in pregnancy, controlled by oral hypoglycemic drugs: Secondary | ICD-10-CM

## 2020-08-15 DIAGNOSIS — Z98891 History of uterine scar from previous surgery: Secondary | ICD-10-CM

## 2020-08-15 DIAGNOSIS — Z3A35 35 weeks gestation of pregnancy: Secondary | ICD-10-CM

## 2020-08-15 DIAGNOSIS — O280 Abnormal hematological finding on antenatal screening of mother: Secondary | ICD-10-CM

## 2020-08-15 DIAGNOSIS — O09299 Supervision of pregnancy with other poor reproductive or obstetric history, unspecified trimester: Secondary | ICD-10-CM

## 2020-08-15 DIAGNOSIS — O09899 Supervision of other high risk pregnancies, unspecified trimester: Secondary | ICD-10-CM

## 2020-08-15 MED ORDER — INSULIN NPH (HUMAN) (ISOPHANE) 100 UNIT/ML ~~LOC~~ SUSP: 10.0000 [IU] | Freq: Every evening | SUBCUTANEOUS | 1 refills | Status: DC

## 2020-08-15 MED ORDER — "INSULIN SYRINGE 31G X 5/16"" 0.3 ML MISC": 1.0000 | Freq: Every day | 1 refills | Status: DC

## 2020-08-15 NOTE — Progress Notes (Signed)
Orthopedic Tech Progress Note Patient Details:  Evadne Ose 1985/11/10 [id number]  Ortho Devices Type of Ortho Device: CAM walker,Crutches Ortho Device/Splint Location: Left Lower Extremity Ortho Device/Splint Interventions: Ordered,Application,Adjustment   Post Interventions Patient Tolerated: Well Instructions Provided: Adjustment of device,Care of device,Poper ambulation with device   Jeiden Daughtridge Clarene Reamer 08/15/2020, 2:23 AM

## 2020-08-15 NOTE — Progress Notes (Signed)
   Subjective:  Kelli Robbins is a 35 y.o. [phone] at [redacted]w[redacted]d being seen today for ongoing prenatal care.  She is currently monitored for the following issues for this high-risk pregnancy and has Supervision of high risk pregnancy, antepartum; History of preterm delivery, currently pregnant; History of pre-eclampsia in prior pregnancy, currently pregnant; History of cesarean section; Abnormal quad screen; and Gestational diabetes mellitus on their problem list.  Patient reports no complaints.  Contractions: Not present. Vag. Bleeding: None.  Movement: Present. Denies leaking of fluid.   The following portions of the patient's history were reviewed and updated as appropriate: allergies, current medications, past family history, past medical history, past social history, past surgical history and problem list. Problem list updated.  Objective:   Vitals:   08/15/20 1509  BP: 126/70  Pulse: 94  Weight: 188 lb (85.3 kg)    Fetal Status: Fetal Heart Rate (bpm): NST   Movement: Present     General:  Alert, oriented and cooperative. Patient is in no acute distress.  Skin: Skin is warm and dry. No rash noted.   Cardiovascular: Normal heart rate noted  Respiratory: Normal respiratory effort, no problems with respiration noted  Abdomen: Soft, gravid, appropriate for gestational age. Pain/Pressure: Absent     Pelvic: Vag. Bleeding: None     Cervical exam deferred        Extremities: Normal range of motion.  Edema: None  Mental Status: Normal mood and affect. Normal behavior. Normal judgment and thought content.   Urinalysis:      Assessment and Plan:  Pregnancy: G3P1102 at [redacted]w[redacted]d  1. Supervision of high risk pregnancy, antepartum BP normal, NST reactive  2. Gestational diabetes mellitus (GDM) in third trimester controlled on oral hypoglycemic drug On Metformin 1g BID Sugars are only 60% at goal Start NPH 10u QHS, will schedule ASAP for insulin teaching w Marylene Land Getting weekly BPP/NST,  today was 10/10  3. History of cesarean section X2, does not seem to be scheduled yet Message sent and orders placed  4. History of preterm delivery, currently pregnant At 7 month sin [place]  5. History of pre-eclampsia in prior pregnancy, currently pregnant On baby ASA  6. Food insecurity  - AMBULATORY REFERRAL TO [facility]  7. Abnormal quad screen Declined amnio, s/p genetic counseling, normal [place] to day  Preterm labor symptoms and general obstetric precautions including but not limited to vaginal bleeding, contractions, leaking of fluid and fetal movement were reviewed in detail with the patient. Please refer to After Visit Summary for other counseling recommendations.  Return in 1 week (on 08/22/2020) for [facility], ob visit.   Venora Maples, MD

## 2020-08-15 NOTE — ED Notes (Signed)
Provided pt with ice pack due to swollen left ankle

## 2020-08-15 NOTE — Progress Notes (Signed)
Pt has [place] for growth/BPP on 5/18. Pt wants to know when her C/S will be scheduled. Pt desires visit to [facility] [facility] today.

## 2020-08-15 NOTE — ED Provider Notes (Signed)
[facility] EMERGENCY DEPARTMENT Provider Note   CSN: [id number] Arrival date & time: 08/14/20  1914     History Chief Complaint  Patient presents with  . Ankle Pain    Kelli Robbins is a 35 y.o. female.  Patient presents to the emergency department with a chief complaint of left ankle pain.  She states that she rolled her ankle while at work today.  She reports that she is 8 months pregnant.  She did not strike her belly.  Denies any fluid gush or vaginal bleeding.  She states that she can feel the baby moving.  She states that regarding the left ankle, she has pain with palpation and movement.  She has been able to ambulate, but reports that it is painful.  She denies any other associated symptoms or injuries.  The history is provided by the patient. No language interpreter was used.       Past Medical History:  Diagnosis Date  . Gestational hypertension   . Preeclampsia     Patient Active Problem List   Diagnosis Date Noted  . Gestational diabetes mellitus 06/27/2020  . Supervision of high risk pregnancy, antepartum 05/08/2020  . History of preterm delivery, currently pregnant 05/08/2020  . History of pre-eclampsia in prior pregnancy, currently pregnant 05/08/2020  . History of cesarean section 05/08/2020  . Abnormal quad screen 05/08/2020    Past Surgical History:  Procedure Laterality Date  . CESAREAN SECTION       OB History    Gravida  3   Para  2   Term  1   Preterm  1   AB  0   Living  2     SAB  0   IAB  0   Ectopic  0   Multiple  0   Live Births  2           Family History  Problem Relation Age of Onset  . Hypertension Mother   . Asthma Father     Social History   Tobacco Use  . Smoking status: Never Smoker  . Smokeless tobacco: Never Used  Vaping Use  . Vaping Use: Never used  Substance Use Topics  . Alcohol use: Never  . Drug use: Never    Home Medications Prior to Admission medications    Medication Sig Start Date End Date Taking? Authorizing Provider  ASPIRIN 81 PO Take by mouth.    [provider]  metFORMIN (GLUCOPHAGE) 1000 MG tablet Take 1 tablet (1,000 mg total) by mouth 2 (two) times daily with a meal. 07/25/20   Venora Maples, MD  Prenatal Vit-Fe Fumarate-FA (PRENATAL VITAMIN PO) Take by mouth.    [provider]    Allergies    Patient has no known allergies.  Review of Systems   Review of Systems  All other systems reviewed and are negative.   Physical Exam Updated Vital Signs BP 118/74   Pulse 74   Temp 98.6 F (37 C) (Oral)   Resp 16   LMP 12/12/2019   SpO2 99%   Physical Exam Nursing note and vitals reviewed.  Constitutional: Pt appears well-developed and well-nourished. No distress.  HENT:  Head: Normocephalic and atraumatic.  Eyes: Conjunctivae are normal.  Neck: Normal range of motion.  Cardiovascular: Normal rate, regular rhythm. Intact distal pulses.   Capillary refill < 3 sec.  Pulmonary/Chest: Effort normal and breath sounds normal.  Gravid abdomen Musculoskeletal:  Left ankle Pt exhibits diffuse  swelling and TTP with some ecchymosis.   ROM: 4/5  Strength: 4/5  Neurological: Pt  is alert. Coordination normal.  Sensation: 5/5 Skin: Skin is warm and dry. Pt is not diaphoretic.  No evidence of open wound or skin tenting Psychiatric: Pt has a normal mood and affect.   ED Results / Procedures / Treatments   Labs (all labs ordered are listed, but only abnormal results are displayed) Labs Reviewed - No data to display  EKG None  Radiology DG Ankle Complete Left  Result Date: 08/14/2020 CLINICAL DATA:  Status post trauma. EXAM: LEFT ANKLE COMPLETE - 3+ VIEW COMPARISON:  None. FINDINGS: There is no evidence of fracture, dislocation, or joint effusion. There is no evidence of arthropathy or other focal bone abnormality. Mild to moderate severity diffuse soft tissue swelling is seen. IMPRESSION: Diffuse soft  tissue swelling without an acute osseous abnormality. Electronically Signed   By: Aram Candela M.D.   On: 08/14/2020 21:31   DG Foot Complete Left  Result Date: 08/14/2020 CLINICAL DATA:  Status post trauma. EXAM: LEFT FOOT - COMPLETE 3+ VIEW COMPARISON:  None. FINDINGS: There is no evidence of fracture or dislocation. There is no evidence of arthropathy or other focal bone abnormality. Diffuse soft tissue swelling is seen, most prominent along the medial and dorsal aspects of the left foot. IMPRESSION: Diffuse soft tissue swelling without an acute osseous abnormality. Electronically Signed   By: Aram Candela M.D.   On: 08/14/2020 21:29    Procedures Procedures   Medications Ordered in ED Medications - No data to display  ED Course  I have reviewed the triage vital signs and the nursing notes.  Pertinent labs & imaging results that were available during my care of the patient were reviewed by me and considered in my medical decision making (see chart for details).    MDM Rules/Calculators/A&P                          Patient presents with injury to left ankle.  DDx includes, fracture, strain, or sprain.  Consultants: none  Plain films reveal soft tissue swelling.  Pt advised to follow up with PCP and/or orthopedics. Patient given cam boot and crutches while in ED, conservative therapy such as RICE recommended and discussed.   Patient will be discharged home & is agreeable with above plan. Returns precautions discussed. Pt appears safe for discharge.  Final Clinical Impression(s) / ED Diagnoses Final diagnoses:  Sprain of left ankle, unspecified ligament, initial encounter    Rx / DC Orders ED Discharge Orders    None       Roxy Horseman, PA-C 08/15/20 0144    Zadie Rhine, MD 08/15/20 [phone]

## 2020-08-15 NOTE — ED Notes (Signed)
Patient verbalized understanding of discharge instructions. Opportunity for questions and answers. Used interpreter for discharge instructions

## 2020-08-15 NOTE — Progress Notes (Signed)
Interpreter Eda Royal present for encounter.  Pt informed that the ultrasound is considered a limited OB ultrasound and is not intended to be a complete ultrasound exam.  Patient also informed that the ultrasound is not being completed with the intent of assessing for fetal or placental anomalies or any pelvic abnormalities.  Explained that the purpose of today's ultrasound is to assess for presentation, BPP and amniotic fluid volume.  Patient acknowledges the purpose of the exam and the limitations of the study.    

## 2020-08-15 NOTE — ED Notes (Signed)
Ortho tech instructed pt on how to use crutches.

## 2020-08-15 NOTE — Patient Instructions (Signed)
 Contraception Choices Contraception, also called birth control, refers to methods or devices that prevent pregnancy. Hormonal methods Contraceptive implant A contraceptive implant is a thin, plastic tube that contains a hormone that prevents pregnancy. It is different from an intrauterine device (IUD). It is inserted into the upper part of the arm by a health care provider. Implants can be effective for up to 3 years. Progestin-only injections Progestin-only injections are injections of progestin, a synthetic form of the hormone progesterone. They are given every 3 months by a health care provider. Birth control pills Birth control pills are pills that contain hormones that prevent pregnancy. They must be taken once a day, preferably at the same time each day. A prescription is needed to use this method of contraception. Birth control patch The birth control patch contains hormones that prevent pregnancy. It is placed on the skin and must be changed once a week for three weeks and removed on the fourth week. A prescription is needed to use this method of contraception. Vaginal ring A vaginal ring contains hormones that prevent pregnancy. It is placed in the vagina for three weeks and removed on the fourth week. After that, the process is repeated with a new ring. A prescription is needed to use this method of contraception. Emergency contraceptive Emergency contraceptives prevent pregnancy after unprotected sex. They come in pill form and can be taken up to 5 days after sex. They work best the sooner they are taken after having sex. Most emergency contraceptives are available without a prescription. This method should not be used as your only form of birth control.   Barrier methods Female condom A female condom is a thin sheath that is worn over the penis during sex. Condoms keep sperm from going inside a woman's body. They can be used with a sperm-killing substance (spermicide) to increase their  effectiveness. They should be thrown away after one use. Female condom A female condom is a soft, loose-fitting sheath that is put into the vagina before sex. The condom keeps sperm from going inside a woman's body. They should be thrown away after one use. Diaphragm A diaphragm is a soft, dome-shaped barrier. It is inserted into the vagina before sex, along with a spermicide. The diaphragm blocks sperm from entering the uterus, and the spermicide kills sperm. A diaphragm should be left in the vagina for 6-8 hours after sex and removed within 24 hours. A diaphragm is prescribed and fitted by a health care provider. A diaphragm should be replaced every 1-2 years, after giving birth, after gaining more than 15 lb (6.8 kg), and after pelvic surgery. Cervical cap A cervical cap is a round, soft latex or plastic cup that fits over the cervix. It is inserted into the vagina before sex, along with spermicide. It blocks sperm from entering the uterus. The cap should be left in place for 6-8 hours after sex and removed within 48 hours. A cervical cap must be prescribed and fitted by a health care provider. It should be replaced every 2 years. Sponge A sponge is a soft, circular piece of polyurethane foam with spermicide in it. The sponge helps block sperm from entering the uterus, and the spermicide kills sperm. To use it, you make it wet and then insert it into the vagina. It should be inserted before sex, left in for at least 6 hours after sex, and removed and thrown away within 30 hours. Spermicides Spermicides are chemicals that kill or block sperm from entering the   cervix and uterus. They can come as a cream, jelly, suppository, foam, or tablet. A spermicide should be inserted into the vagina with an applicator at least 10-15 minutes before sex to allow time for it to work. The process must be repeated every time you have sex. Spermicides do not require a prescription.   Intrauterine  contraception Intrauterine device (IUD) An IUD is a T-shaped device that is put in a woman's uterus. There are two types:  Hormone IUD.This type contains progestin, a synthetic form of the hormone progesterone. This type can stay in place for 3-5 years.  Copper IUD.This type is wrapped in copper wire. It can stay in place for 10 years. Permanent methods of contraception Female tubal ligation In this method, a woman's fallopian tubes are sealed, tied, or blocked during surgery to prevent eggs from traveling to the uterus. Hysteroscopic sterilization In this method, a small, flexible insert is placed into each fallopian tube. The inserts cause scar tissue to form in the fallopian tubes and block them, so sperm cannot reach an egg. The procedure takes about 3 months to be effective. Another form of birth control must be used during those 3 months. Female sterilization This is a procedure to tie off the tubes that carry sperm (vasectomy). After the procedure, the man can still ejaculate fluid (semen). Another form of birth control must be used for 3 months after the procedure. Natural planning methods Natural family planning In this method, a couple does not have sex on days when the woman could become pregnant. Calendar method In this method, the woman keeps track of the length of each menstrual cycle, identifies the days when pregnancy can happen, and does not have sex on those days. Ovulation method In this method, a couple avoids sex during ovulation. Symptothermal method This method involves not having sex during ovulation. The woman typically checks for ovulation by watching changes in her temperature and in the consistency of cervical mucus. Post-ovulation method In this method, a couple waits to have sex until after ovulation. Where to find more information  Centers for Disease Control and Prevention: www.cdc.gov Summary  Contraception, also called birth control, refers to methods or  devices that prevent pregnancy.  Hormonal methods of contraception include implants, injections, pills, patches, vaginal rings, and emergency contraceptives.  Barrier methods of contraception can include female condoms, female condoms, diaphragms, cervical caps, sponges, and spermicides.  There are two types of IUDs (intrauterine devices). An IUD can be put in a woman's uterus to prevent pregnancy for 3-5 years.  Permanent sterilization can be done through a procedure for males and females. Natural family planning methods involve nothaving sex on days when the woman could become pregnant. This information is not intended to replace advice given to you by your health care provider. Make sure you discuss any questions you have with your health care provider. Document Revised: 09/05/2019 Document Reviewed: 09/05/2019 Elsevier Patient Education  2021 Else[employer] Inc.   Breastfeeding  Choosing to breastfeed is one of the best decisions you can make for yourself and your baby. A change in hormones during pregnancy causes your breasts to make breast milk in your milk-producing glands. Hormones prevent breast milk from being released before your baby is born. They also prompt milk flow after birth. Once breastfeeding has begun, thoughts of your baby, as well as his or her sucking or crying, can stimulate the release of milk from your milk-producing glands. Benefits of breastfeeding Research shows that breastfeeding offers many health benefits   for infants and mothers. It also offers a cost-free and convenient way to feed your baby. For your baby  Your first milk (colostrum) helps your baby's digestive system to function better.  Special cells in your milk (antibodies) help your baby to fight off infections.  Breastfed babies are less likely to develop asthma, allergies, obesity, or type 2 diabetes. They are also at lower risk for sudden infant death syndrome (SIDS).  Nutrients in breast milk are better  able to meet your baby's needs compared to infant formula.  Breast milk improves your baby's brain development. For you  Breastfeeding helps to create a very special bond between you and your baby.  Breastfeeding is convenient. Breast milk costs nothing and is always available at the correct temperature.  Breastfeeding helps to burn calories. It helps you to lose the weight that you gained during pregnancy.  Breastfeeding makes your uterus return faster to its size before pregnancy. It also slows bleeding (lochia) after you give birth.  Breastfeeding helps to lower your risk of developing type 2 diabetes, osteoporosis, rheumatoid arthritis, cardiovascular disease, and breast, ovarian, uterine, and endometrial cancer later in life. Breastfeeding basics Starting breastfeeding  Find a comfortable place to sit or lie down, with your neck and back well-supported.  Place a pillow or a rolled-up blanket under your baby to bring him or her to the level of your breast (if you are seated). Nursing pillows are specially designed to help support your arms and your baby while you breastfeed.  Make sure that your baby's tummy (abdomen) is facing your abdomen.  Gently massage your breast. With your fingertips, massage from the outer edges of your breast inward toward the nipple. This encourages milk flow. If your milk flows slowly, you may need to continue this action during the feeding.  Support your breast with 4 fingers underneath and your thumb above your nipple (make the letter "C" with your hand). Make sure your fingers are well away from your nipple and your baby's mouth.  Stroke your baby's lips gently with your finger or nipple.  When your baby's mouth is open wide enough, quickly bring your baby to your breast, placing your entire nipple and as much of the areola as possible into your baby's mouth. The areola is the colored area around your nipple. ? More areola should be visible above your  baby's upper lip than below the lower lip. ? Your baby's lips should be opened and extended outward (flanged) to ensure an adequate, comfortable latch. ? Your baby's tongue should be between his or her lower gum and your breast.  Make sure that your baby's mouth is correctly positioned around your nipple (latched). Your baby's lips should create a seal on your breast and be turned out (everted).  It is common for your baby to suck about 2-3 minutes in order to start the flow of breast milk. Latching Teaching your baby how to latch onto your breast properly is very important. An improper latch can cause nipple pain, decreased milk supply, and poor weight gain in your baby. Also, if your baby is not latched onto your nipple properly, he or she may swallow some air during feeding. This can make your baby fussy. Burping your baby when you switch breasts during the feeding can help to get rid of the air. However, teaching your baby to latch on properly is still the best way to prevent fussiness from swallowing air while breastfeeding. Signs that your baby has successfully latched onto   your nipple  Silent tugging or silent sucking, without causing you pain. Infant's lips should be extended outward (flanged).  Swallowing heard between every 3-4 sucks once your milk has started to flow (after your let-down milk reflex occurs).  Muscle movement above and in front of his or her ears while sucking. Signs that your baby has not successfully latched onto your nipple  Sucking sounds or smacking sounds from your baby while breastfeeding.  Nipple pain. If you think your baby has not latched on correctly, slip your finger into the corner of your baby's mouth to break the suction and place it between your baby's gums. Attempt to start breastfeeding again. Signs of successful breastfeeding Signs from your baby  Your baby will gradually decrease the number of sucks or will completely stop sucking.  Your baby  will fall asleep.  Your baby's body will relax.  Your baby will retain a small amount of milk in his or her mouth.  Your baby will let go of your breast by himself or herself. Signs from you  Breasts that have increased in firmness, weight, and size 1-3 hours after feeding.  Breasts that are softer immediately after breastfeeding.  Increased milk volume, as well as a change in milk consistency and color by the fifth day of breastfeeding.  Nipples that are not sore, cracked, or bleeding. Signs that your baby is getting enough milk  Wetting at least 1-2 diapers during the first 24 hours after birth.  Wetting at least 5-6 diapers every 24 hours for the first week after birth. The urine should be clear or pale yellow by the age of 5 days.  Wetting 6-8 diapers every 24 hours as your baby continues to grow and develop.  At least 3 stools in a 24-hour period by the age of 5 days. The stool should be soft and yellow.  At least 3 stools in a 24-hour period by the age of 7 days. The stool should be seedy and yellow.  No loss of weight greater than 10% of birth weight during the first 3 days of life.  Average weight gain of 4-7 oz (113-198 g) per week after the age of 4 days.  Consistent daily weight gain by the age of 5 days, without weight loss after the age of 2 weeks. After a feeding, your baby may spit up a small amount of milk. This is normal. Breastfeeding frequency and duration Frequent feeding will help you make more milk and can prevent sore nipples and extremely full breasts (breast engorgement). Breastfeed when you feel the need to reduce the fullness of your breasts or when your baby shows signs of hunger. This is called "breastfeeding on demand." Signs that your baby is hungry include:  Increased alertness, activity, or restlessness.  Movement of the head from side to side.  Opening of the mouth when the corner of the mouth or cheek is stroked (rooting).  Increased  sucking sounds, smacking lips, cooing, sighing, or squeaking.  Hand-to-mouth movements and sucking on fingers or hands.  Fussing or crying. Avoid introducing a pacifier to your baby in the first 4-6 weeks after your baby is born. After this time, you may choose to use a pacifier. Research has shown that pacifier use during the first year of a baby's life decreases the risk of sudden infant death syndrome (SIDS). Allow your baby to feed on each breast as long as he or she wants. When your baby unlatches or falls asleep while feeding from the   first breast, offer the second breast. Because newborns are often sleepy in the first few weeks of life, you may need to awaken your baby to get him or her to feed. Breastfeeding times will vary from baby to baby. However, the following rules can serve as a guide to help you make sure that your baby is properly fed:  Newborns (babies 4 weeks of age or younger) may breastfeed every 1-3 hours.  Newborns should not go without breastfeeding for longer than 3 hours during the day or 5 hours during the night.  You should breastfeed your baby a minimum of 8 times in a 24-hour period. Breast milk pumping Pumping and storing breast milk allows you to make sure that your baby is exclusively fed your breast milk, even at times when you are unable to breastfeed. This is especially important if you go back to work while you are still breastfeeding, or if you are not able to be present during feedings. Your lactation consultant can help you find a method of pumping that works best for you and give you guidelines about how long it is safe to store breast milk.      Caring for your breasts while you breastfeed Nipples can become dry, cracked, and sore while breastfeeding. The following recommendations can help keep your breasts moisturized and healthy:  Avoid using soap on your nipples.  Wear a supportive bra designed especially for nursing. Avoid wearing underwire-style  bras or extremely tight bras (sports bras).  Air-dry your nipples for 3-4 minutes after each feeding.  Use only cotton bra pads to absorb leaked breast milk. Leaking of breast milk between feedings is normal.  Use lanolin on your nipples after breastfeeding. Lanolin helps to maintain your skin's normal moisture barrier. Pure lanolin is not harmful (not toxic) to your baby. You may also hand express a few drops of breast milk and gently massage that milk into your nipples and allow the milk to air-dry. In the first few weeks after giving birth, some women experience breast engorgement. Engorgement can make your breasts feel heavy, warm, and tender to the touch. Engorgement peaks within 3-5 days after you give birth. The following recommendations can help to ease engorgement:  Completely empty your breasts while breastfeeding or pumping. You may want to start by applying warm, moist heat (in the shower or with warm, water-soaked hand towels) just before feeding or pumping. This increases circulation and helps the milk flow. If your baby does not completely empty your breasts while breastfeeding, pump any extra milk after he or she is finished.  Apply ice packs to your breasts immediately after breastfeeding or pumping, unless this is too uncomfortable for you. To do this: ? Put ice in a plastic bag. ? Place a towel between your skin and the bag. ? Leave the ice on for 20 minutes, 2-3 times a day.  Make sure that your baby is latched on and positioned properly while breastfeeding. If engorgement persists after 48 hours of following these recommendations, contact your health care provider or a lactation consultant. Overall health care recommendations while breastfeeding  Eat 3 healthy meals and 3 snacks every day. Well-nourished mothers who are breastfeeding need an additional 450-500 calories a day. You can meet this requirement by increasing the amount of a balanced diet that you eat.  Drink  enough water to keep your urine pale yellow or clear.  Rest often, relax, and continue to take your prenatal vitamins to prevent fatigue, stress, and low   vitamin and mineral levels in your body (nutrient deficiencies).  Do not use any products that contain nicotine or tobacco, such as cigarettes and e-cigarettes. Your baby may be harmed by chemicals from cigarettes that pass into breast milk and exposure to secondhand smoke. If you need help quitting, ask your health care provider.  Avoid alcohol.  Do not use illegal drugs or marijuana.  Talk with your health care provider before taking any medicines. These include over-the-counter and prescription medicines as well as vitamins and herbal supplements. Some medicines that may be harmful to your baby can pass through breast milk.  It is possible to become pregnant while breastfeeding. If birth control is desired, ask your health care provider about options that will be safe while breastfeeding your baby. Where to find more information: [employer]: www.llli.org Contact a health care provider if:  You feel like you want to stop breastfeeding or have become frustrated with breastfeeding.  Your nipples are cracked or bleeding.  Your breasts are red, tender, or warm.  You have: ? Painful breasts or nipples. ? A swollen area on either breast. ? A fever or chills. ? Nausea or vomiting. ? Drainage other than breast milk from your nipples.  Your breasts do not become full before feedings by the fifth day after you give birth.  You feel sad and depressed.  Your baby is: ? Too sleepy to eat well. ? Having trouble sleeping. ? More than 1 week old and wetting fewer than 6 diapers in a 24-hour period. ? Not gaining weight by 5 days of age.  Your baby has fewer than 3 stools in a 24-hour period.  Your baby's skin or the white parts of his or her eyes become yellow. Get help right away if:  Your baby is overly tired  (lethargic) and does not want to wake up and feed.  Your baby develops an unexplained fever. Summary  Breastfeeding offers many health benefits for infant and mothers.  Try to breastfeed your infant when he or she shows early signs of hunger.  Gently tickle or stroke your baby's lips with your finger or nipple to allow the baby to open his or her mouth. Bring the baby to your breast. Make sure that much of the areola is in your baby's mouth. Offer one side and burp the baby before you offer the other side.  Talk with your health care provider or lactation consultant if you have questions or you face problems as you breastfeed. This information is not intended to replace advice given to you by your health care provider. Make sure you discuss any questions you have with your health care provider. Document Revised: 06/25/2017 Document Reviewed: 05/02/2016 Elsevier Patient Education  2021 Elsevier Inc.  

## 2020-08-16 ENCOUNTER — Encounter: Payer: Self-pay | Admitting: General Practice

## 2020-08-16 NOTE — Progress Notes (Signed)
Interpreter Angelica #[id number] from [facility] video   Insulin Instruction  Patient was seen on 08/15/2020 for insulin instruction.   MD orders are: 10 units NPH at bedtime  The following learning objectives were met by the patient during this visit:   Insulin Action of NPH insulins  Reviewed syringe & vial including # units per syringe and vial  Hygiene and storage  Drawing up single and mixed doses if using vials   Single dose  Rotation of Sites  Hypoglycemia- symptoms, causes, treatment choices  Record keeping and MD follow up  Patient demonstrated understanding of insulin administration by return demonstration.  Patient received the following handouts:  Insulin Instruction Handout                                        Patient to start on insulin as Rx'd by MD  Patient will be seen for follow-up as needed.

## 2020-08-20 ENCOUNTER — Encounter: Payer: Self-pay | Admitting: *Deleted

## 2020-08-21 ENCOUNTER — Other Ambulatory Visit: Payer: Self-pay

## 2020-08-22 ENCOUNTER — Telehealth: Payer: Self-pay | Admitting: *Deleted

## 2020-08-22 NOTE — Telephone Encounter (Signed)
Call to patient with Spanish Interpretor from [place], [place] [address]735. Advised c-section scheduled for 09-10-20 at 0930, arrive 0730.  Map and letter ( in [place]) sent in mail. Will receive call from hospital with additional instructions.  Encounter closed.

## 2020-08-23 ENCOUNTER — Encounter (HOSPITAL_COMMUNITY): Payer: Self-pay

## 2020-08-23 ENCOUNTER — Other Ambulatory Visit: Payer: Self-pay

## 2020-08-23 ENCOUNTER — Other Ambulatory Visit (HOSPITAL_COMMUNITY)
Admission: RE | Admit: 2020-08-23 | Discharge: 2020-08-23 | Disposition: A | Discharge status: Home / Self Care | Payer: Self-pay | Source: Ambulatory Visit | Attending: Certified Nurse Midwife | Admitting: Certified Nurse Midwife

## 2020-08-23 ENCOUNTER — Encounter: Payer: Self-pay | Admitting: Certified Nurse Midwife

## 2020-08-23 ENCOUNTER — Telehealth (HOSPITAL_COMMUNITY): Payer: Self-pay | Admitting: *Deleted

## 2020-08-23 ENCOUNTER — Encounter: Payer: Self-pay | Admitting: *Deleted

## 2020-08-23 ENCOUNTER — Ambulatory Visit (INDEPENDENT_AMBULATORY_CARE_PROVIDER_SITE_OTHER): Payer: Self-pay | Admitting: Obstetrics and Gynecology

## 2020-08-23 VITALS — BP 109/71 | HR 89 | Wt 185.0 lb

## 2020-08-23 DIAGNOSIS — Z98891 History of uterine scar from previous surgery: Secondary | ICD-10-CM

## 2020-08-23 DIAGNOSIS — O0990 Supervision of high risk pregnancy, unspecified, unspecified trimester: Secondary | ICD-10-CM | POA: Insufficient documentation

## 2020-08-23 DIAGNOSIS — O09899 Supervision of other high risk pregnancies, unspecified trimester: Secondary | ICD-10-CM

## 2020-08-23 DIAGNOSIS — O24410 Gestational diabetes mellitus in pregnancy, diet controlled: Secondary | ICD-10-CM

## 2020-08-23 DIAGNOSIS — O24415 Gestational diabetes mellitus in pregnancy, controlled by oral hypoglycemic drugs: Secondary | ICD-10-CM

## 2020-08-23 LAB — POCT URINALYSIS DIP (DEVICE)
Bilirubin Urine: NEGATIVE
Glucose, UA: NEGATIVE mg/dL
Hgb urine dipstick: NEGATIVE
Leukocytes,Ua: NEGATIVE
Nitrite: NEGATIVE
Protein, ur: NEGATIVE mg/dL
Specific Gravity, Urine: 1.025 (ref 1.005–1.030)
Urobilinogen, UA: 0.2 mg/dL (ref 0.0–1.0)
pH: 5.5 (ref 5.0–8.0)

## 2020-08-23 MED ORDER — METFORMIN HCL 1000 MG PO TABS: 1000.0000 mg | ORAL_TABLET | Freq: Two times a day (BID) | ORAL | 5 refills | Status: DC

## 2020-08-23 NOTE — Patient Instructions (Signed)
Parto por cesrea Cesarean Delivery El nacimiento o parto por cesrea es el parto quirrgico de un beb a travs de una incisin en el abdomen y Careers information officer. Se lo conoce como cesrea. Este procedimiento se puede programar con anticipacin o se puede Publishing copy una situacin de Associate Professor. Informe al mdico acerca de lo siguiente:  Cualquier alergia que tenga.  Todos los [employer] Disney, incluidos vitaminas, hierbas, gotas oftlmicas, cremas y [address] de [address].  Cualquier problema previo que usted o algn miembro de su familia haya tenido con los anestsicos.  Cualquier trastorno de la sangre que tenga.  Cirugas a las que se haya sometido.  Cualquier afeccin mdica que tenga.  Si usted o Research scientist (physical sciences) tiene antecedentes de trombosis venosa profunda (TVP) o embolia pulmonar (EP). Cules son los riesgos? En general, se trata de un procedimiento seguro. Sin embargo, pueden ocurrir complicaciones, por ejemplo:  Infeccin.  Sangrado.  Reacciones alrgicas a los medicamentos.  Daos a otras estructuras u rganos.  Cogulos de [place].  Lesiones al beb. Qu ocurre antes del procedimiento? Indicaciones generales  Siga las indicaciones del mdico respecto de las restricciones en las comidas o las bebidas.  Si sabe que va a tener un parto por cesrea, no se rasure la zona pbica. Rasurarse antes del procedimiento puede aumentar el riesgo de infeccin.  Pdale a alguien que la lleve a su casa desde el hospital.  Pregntele al mdico qu medidas se tomarn para prevenir una infeccin. Estas pueden incluir: ? Rasurar el vello del lugar de la ciruga. ? Lavar la piel con un jabn antisptico. ? Suministrar antibiticos.  Segn el motivo del parto por cesrea, es posible que se le realice un examen fsico o una prueba adicional, como una ecografa.  Posiblemente le realicen un anlisis de [place]. Preguntas para hacerle al mdico  Consulte al mdico  sobre: ? Multimedia programmer o suspender los medicamentos que toma habitualmente. Esto es muy importante si toma medicamentos para la diabetes o anticoagulantes. ? El plan para Human resources officer. Esto es muy importante si piensa amamantar a su beb. ? Designer, industrial/product hospital despus del procedimiento. ? Cualquier inquietud que pueda tener sobre recibir hemoderivados, si los necesita durante el procedimiento. ? Almacenamiento de sangre del cordn, si planea guardar la sangre del cordn umbilical del beb.  Quiz tambin desee preguntarle al mdico lo siguiente: ? Si va a poder Publishing rights manager a su beb mientras an se encuentre en el quirfano. ? Si su beb puede quedarse con usted inmediatamente despus del procedimiento y durante su recuperacin. ? Si un familiar o la persona que usted elija puede ingresar al quirfano y Personal assistant con usted durante el procedimiento, inmediatamente despus de este y durante su recuperacin. Qu ocurre durante el procedimiento?  Le colocarn una va intravenosa en una vena.  Le administrarn lquido y [address], tales como antibiticos, antes de la [place].  Le colocarn monitores fetales en el abdomen para controlar la frecuencia cardaca de su beb.  Se le puede entregar una bata especial de calentamiento para mantener estable la temperatura corporal.  Se le puede insertar un catter en la vejiga a travs de la uretra. Esto se hace para drenar la orina durante el procedimiento.  Pueden administrarle uno o ms de los siguientes medicamentos: ? Un medicamento para adormecer la zona (anestesia local). ? Un medicamento que la har dormir (anestesia general). ? Un medicamento (anestesia regional) que se le inyecta en la espalda o a travs  de un tubo pequeo y delgado que se le coloca en la espalda (anestesia raqudea o anestesia epidural). Esto adormece la parte del cuerpo que est por debajo del sitio de la inyeccin y le permite permanecer  despierta durante el procedimiento. Si llega a sentir nuseas, dgaselo al mdico. Tendr medicamentos disponibles para ayudarla a reducir cualquier nusea que pueda sentir.  Le harn una incisin en el abdomen y luego en el tero.  Si est despierta durante el procedimiento, puede sentir tirones en el abdomen, pero no debera sentir dolor. Si siente dolor, dgaselo al mdico inmediatamente.  Se sacar al beb del tero. Es posible que sienta ms presin o un tirn, mientras esto sucede.  Inmediatamente despus del nacimiento, se secar al beb y se lo mantendr caliente. Podr sostener y amamantar a su beb.  Durante este momento, es posible que se pince y corte el cordn umbilical. Esto ocurre por lo general despus de un perodo de 1 a 2 minutos despus del parto.  Se le extraer la placenta del tero.  Las incisiones se cerrarn con puntos (suturas). Es posible que se apliquen grapas, goma para cerrar la piel o tiras adhesivas en la incisin del abdomen.  Pueden colocarle vendas (vendajes) sobre la incisin del abdomen. El procedimiento puede variar segn el mdico y el hospital.   Qu ocurre despus del procedimiento?  Le controlarn la presin arterial, la frecuencia cardaca, la frecuencia respiratoria y el nivel de oxgeno en la sangre hasta que le den el alta del hospital.  Puede seguir recibiendo lquidos o medicamentos por va intravenosa.  Sentir un poco de dolor. Tendr analgsicos disponibles para ayudarla a controlar el dolor.  Para evitar la formacin de cogulos de sangre: ? Se le pueden administrar medicamentos. ? Quizs deba usar medias o dispositivos de compresin. ? Se le indicar que camine cuando pueda hacerlo.  El personal del hospital alentar y apoyar que cree lazos con su beb. En el hospital, es posible que le permitan que el beb permanezca en la misma habitacin que usted (internacin conjunta) durante la hospitalizacin para fomentar la creacin del  vnculo y un amamantamiento exitoso.  Se le puede sugerir que tosa y respire profundamente con frecuencia. Esto ayuda a evitar problemas pulmonares.  Si tiene un catter que le drena la orina, se le quitar lo antes posible despus del procedimiento. Resumen  El nacimiento o parto por cesrea es el parto quirrgico de un beb a travs de una incisin en el abdomen y el tero.  Siga las indicaciones del mdico con respecto a las restricciones de comidas o bebidas antes del procedimiento.  Sentir algo de dolor despus del procedimiento. Tendr analgsicos disponibles para ayudarla a controlar el dolor.  El personal del hospital alentar y apoyar que cree lazos con su beb despus del procedimiento. En el hospital, es posible que le permitan que el beb permanezca en la misma habitacin que usted (internacin conjunta) durante la hospitalizacin para fomentar la creacin del vnculo y un amamantamiento exitoso. Esta informacin no tiene como fin reemplazar el consejo del mdico. Asegrese de hacerle al mdico cualquier pregunta que tenga. Document Revised: 01/25/2020 Document Reviewed: 11/12/2017 Elsevier Patient Education  2021 Elsevier Inc.  

## 2020-08-23 NOTE — Progress Notes (Signed)
   PRENATAL VISIT NOTE  Subjective:  Kelli Robbins is a 35 y.o. [phone] at [redacted]w[redacted]d being seen today for ongoing prenatal care.  She is currently monitored for the following issues for this high-risk pregnancy and has Supervision of high risk pregnancy, antepartum; History of preterm delivery, currently pregnant; History of pre-eclampsia in prior pregnancy, currently pregnant; History of cesarean section; Abnormal quad screen; and Gestational diabetes mellitus on their problem list.  Patient reports no complaints.  Contractions: Not present. Vag. Bleeding: None.  Movement: Present. Denies leaking of fluid.   Doing well. Started insulin without issue. CBG log reviewed and ~90% at goal. A few dinner post prandials >120.   The following portions of the patient's history were reviewed and updated as appropriate: allergies, current medications, past family history, past medical history, past social history, past surgical history and problem list.   Objective:   Vitals:   08/23/20 1406  BP: 109/71  Pulse: 89  Weight: 185 lb (83.9 kg)    Fetal Status: Fetal Heart Rate (bpm): 131 Fundal Height: 36 cm Movement: Present     General:  Alert, oriented and cooperative. Patient is in no acute distress.  Skin: Skin is warm and dry. No rash noted.   Cardiovascular: Normal heart rate noted  Respiratory: Normal respiratory effort, no problems with respiration noted  Abdomen: Soft, gravid, appropriate for gestational age.  Pain/Pressure: Absent     Pelvic: Cervical exam deferred        Extremities: Normal range of motion.  Edema: None  Mental Status: Normal mood and affect. Normal behavior. Normal judgment and thought content.   Assessment and Plan:  Pregnancy: G3P1102 at [redacted]w[redacted]d 1. Supervision of high risk pregnancy, antepartum -swabs obtained -discussed where to go for scheduled CS   2. Gestational diabetes mellitus (GDM) in third trimester controlled on oral hypoglycemic drug -on metformin  BID, started on NPH 10 units  -continue current regimen  3. History of cesarean section -scheduled for repeat CS w BTL   4. History of preterm delivery, currently pregnant -at 7 months [place] Not on Makena    Preterm labor symptoms and general obstetric precautions including but not limited to vaginal bleeding, contractions, leaking of fluid and fetal movement were reviewed in detail with the patient. Please refer to After Visit Summary for other counseling recommendations.   Return in about 1 week (around 08/30/2020) for OB.  Future Appointments  Date Time Provider Department Center  08/28/2020  8:15 AM [facility]  08/29/2020  8:30 AM [facility] NURSE [facility]  08/29/2020  8:45 AM [facility] US5 [facility]  09/07/2020  9:00 AM [facility]LD PAT 1 [facility]INDC None  09/07/2020 10:00 AM MC-SCREENING [facility]SDSC None    Gita Kudo, MD

## 2020-08-23 NOTE — Telephone Encounter (Signed)
Preadmission screen [phone] interpreter number

## 2020-08-23 NOTE — Patient Instructions (Addendum)
Instrucciones Para Antes de la Ciruga   Su ciruga est programada para 09/10/2020  (your procedure is scheduled on) Entre por la entrada principal del [facility]  a las 0730 de la [place] -(enter through the main entrance at [facility] at [employer] AM    [employer] telfono, [place] [phone] para informarnos de su llegada. (pick up phone, dial [phone] on arrival)     Por favor llame al [phone] si tiene algn problema en la maana de la ciruga (please call this number if you have any problems the morning of surgery.)                  Recuerde: (Remember)  No coma alimentos. (Do not eat food (After Midnight) Desps de medianoche)    No tome lquidos claros. (Do not drink clear liquids (After Midnight) Desps de medianoche)    No use joyas, maquillaje de ojos, lpiz labial, crema para el cuerpo o esmalte de uas oscuro. (Do not wear jewelry, eye makeup, lipstick, body lotion, or dark fingernail polish). Puede usar desodorante (you may wear deodorant)    No se afeite 48 horas de su ciruga. (Do not shave 48 hours before your surgery)    No traiga objetos de valor al hospital.  [place] no se hace responsable de ninguna pertenencia, ni objetos de valor que haya trado al hospital. (Do not bring valuable to the hospital.  [place] is not responsible for any belongings or valuables brought to the hospital)   [facility] medicinas en la maana de la ciruga con un SORBITO de agua 5 units of insulin (take these meds the morning of surgery with a SIP of water)  No medicines the morning of surgery   Durante la ciruga no se pueden usar lentes de contacto, dentaduras o puentes. (Contacts, dentures or bridgework cannot be worn in surgery).   Si va a ser ingresado despus de la ciruga, deje la [employer] en el carro hasta que se le haya asignado una habitacin. (If you are to be admitted after surgery, leave suitcase in car until your room has  been assigned.)   A los pacientes que se les d de alta el mismo da no se les permitir manejar a casa.  (Patients discharged on the day of surgery will not be allowed to drive home)    [place] y nmero de telfono del Programmer, multimedia na. (Name and telephone number of your driver)   Instrucciones especiales N/A (Special Instructions)   Por favor, lea las hojas informativas que le entregaron. (Please read over the following fact sheets that you were given) Surgical Site Infection Prevention

## 2020-08-24 LAB — GC/CHLAMYDIA PROBE AMP (~~LOC~~) NOT AT ARMC
Chlamydia: NEGATIVE
Comment: NEGATIVE
Comment: NORMAL
Neisseria Gonorrhea: NEGATIVE

## 2020-08-26 LAB — CULTURE, BETA STREP (GROUP B ONLY): Strep Gp B Culture: NEGATIVE

## 2020-08-28 ENCOUNTER — Encounter: Payer: Self-pay | Admitting: Registered"

## 2020-08-28 ENCOUNTER — Encounter: Payer: Self-pay | Admitting: Family Medicine

## 2020-08-28 ENCOUNTER — Ambulatory Visit (INDEPENDENT_AMBULATORY_CARE_PROVIDER_SITE_OTHER): Payer: Self-pay | Admitting: Family Medicine

## 2020-08-28 ENCOUNTER — Ambulatory Visit: Payer: Self-pay | Admitting: Registered"

## 2020-08-28 ENCOUNTER — Other Ambulatory Visit: Payer: Self-pay

## 2020-08-28 VITALS — BP 115/70 | HR 80 | Wt 185.4 lb

## 2020-08-28 DIAGNOSIS — O09899 Supervision of other high risk pregnancies, unspecified trimester: Secondary | ICD-10-CM

## 2020-08-28 DIAGNOSIS — Z98891 History of uterine scar from previous surgery: Secondary | ICD-10-CM

## 2020-08-28 DIAGNOSIS — O24415 Gestational diabetes mellitus in pregnancy, controlled by oral hypoglycemic drugs: Secondary | ICD-10-CM

## 2020-08-28 DIAGNOSIS — O0990 Supervision of high risk pregnancy, unspecified, unspecified trimester: Secondary | ICD-10-CM

## 2020-08-28 DIAGNOSIS — O280 Abnormal hematological finding on antenatal screening of mother: Secondary | ICD-10-CM

## 2020-08-28 DIAGNOSIS — O09299 Supervision of pregnancy with other poor reproductive or obstetric history, unspecified trimester: Secondary | ICD-10-CM

## 2020-08-28 DIAGNOSIS — O24419 Gestational diabetes mellitus in pregnancy, unspecified control: Secondary | ICD-10-CM

## 2020-08-28 DIAGNOSIS — O24414 Gestational diabetes mellitus in pregnancy, insulin controlled: Secondary | ICD-10-CM

## 2020-08-28 MED ORDER — INSULIN NPH (HUMAN) (ISOPHANE) 100 UNIT/ML ~~LOC~~ SUSP: 10.0000 [IU] | Freq: Two times a day (BID) | SUBCUTANEOUS | 1 refills | Status: DC

## 2020-08-28 NOTE — Patient Instructions (Signed)
 Eleccin del mtodo anticonceptivo Contraception Choices La anticoncepcin, o los mtodos anticonceptivos, hace referencia a los mtodos o dispositivos que evitan el embarazo. Mtodos hormonales Implante anticonceptivo Un implante anticonceptivo consiste en un tubo delgado de plstico que contiene una hormona que evita el embarazo. Es diferente de un dispositivo intrauterino (DIU). Un mdico lo inserta en la parte superior del brazo. Los implantes pueden ser eficaces durante un mximo de 3 aos. Inyecciones de progestina sola Las inyecciones de progestina sola contienen progestina, una forma sinttica de la hormona progesterona. Un mdico las administra cada 3 meses. Pldoras anticonceptivas Las pldoras anticonceptivas son pastillas que contienen hormonas que evitan el embarazo. Deben tomarse una vez al da, preferentemente a la misma hora cada da. Se necesita una receta para utilizar este mtodo anticonceptivo. Parche anticonceptivo El parche anticonceptivo contiene hormonas que evitan el embarazo. Se coloca en la piel, debe cambiarse una vez a la semana durante tres semanas y debe retirarse en la cuarta semana. Se necesita una receta para utilizar este mtodo anticonceptivo. Anillo vaginal Un anillo vaginal contiene hormonas que evitan el embarazo. Se coloca en la vagina durante tres semanas y se retira en la cuarta semana. Luego se repite el proceso con un anillo nuevo. Se necesita una receta para utilizar este mtodo anticonceptivo. Anticonceptivo de emergencia Los anticonceptivos de emergencia son mtodos para evitar un embarazo despus de tener sexo sin proteccin. Vienen en forma de pldora y pueden tomarse hasta 5 das despus de tener sexo. Funcionan mejor cuando se toman lo ms pronto posible luego de tener sexo. La mayora de los anticonceptivos de emergencia estn disponibles sin receta mdica. Este mtodo no debe utilizarse como el nico mtodo anticonceptivo.   Mtodos de  barrera Condn masculino Un condn masculino es una vaina delgada que se coloca sobre el pene durante el sexo. Los condones evitan que el esperma ingrese en el cuerpo de la mujer. Pueden utilizarse con un una sustancia que mata a los espermatozoides (espermicida) para aumentar la efectividad. Deben desecharse despus de un uso. Condn femenino Un condn femenino es una vaina blanda y holgada que se coloca en la vagina antes de tener sexo. El condn evita que el esperma ingrese en el cuerpo de la mujer. Deben desecharse despus de un uso. Diafragma Un diafragma es una barrera blanda con forma de cpula. Se inserta en la vagina antes del sexo, junto con un espermicida. El diafragma bloquea el ingreso de esperma en el tero, y el espermicida mata a los espermatozoides. El diafragma debe permanecer en la vagina durante 6 a 8 horas despus de tener sexo y debe retirarse en el plazo de las 24 horas. Un diafragma es recetado y colocado por un mdico. Debe reemplazarse cada 1 a 2 aos, despus de dar a luz, de aumentar ms de 15lb (6.8kg) y de una ciruga plvica. Capuchn cervical Un capuchn cervical es una copa redonda y blanda de ltex o plstico que se coloca en el cuello uterino. Se inserta en la vagina antes del sexo, junto con un espermicida. Bloquea el ingreso del esperma en el tero. El capuchn debe permanecer en el lugar durante 6 a 8 horas despus de tener sexo y debe retirarse en el plazo de las 48 horas. Un capuchn cervical debe ser recetado y colocado por un mdico. Debe reemplazarse cada 2aos. Esponja Una esponja es una pieza blanda y circular de espuma de poliuretano que contiene espermicida. La esponja ayuda a bloquear el ingreso de esperma en el tero, y el   espermicida mata a los espermatozoides. Para utilizarla, debe humedecerla e insertarla en la vagina. Debe insertarse antes de tener sexo, debe permanecer dentro al menos durante 6 horas despus de tener sexo y debe retirarse y  desecharse en el plazo de las 30 horas. Espermicidas Los espermicidas son sustancias qumicas que matan o bloquean al esperma y no lo dejan ingresar al cuello uterino y al tero. Vienen en forma de crema, gel, supositorio, espuma o comprimido. Un espermicida debe insertarse en la vagina con un aplicador al menos 10 o 15 minutos antes de tener sexo para dar tiempo a que surta efecto. El proceso debe repetirse cada vez que tenga sexo. Los espermicidas no requieren receta mdica.   Anticonceptivos intrauterinos Dispositivo intrauterino (DIU) Un DIU es un dispositivo en forma de T que se coloca en el tero. Existen dos tipos:  DIU hormonal.Este tipo contiene progestina, una forma sinttica de la hormona progesterona. Este tipo puede permanecer colocado durante 3 a 5 aos.  DIU de cobre.Este tipo est recubierto con un alambre de cobre. Puede permanecer colocado durante 10 aos. Mtodos anticonceptivos permanentes Ligadura de trompas en la mujer En este mtodo, se sellan, atan u obstruyen las trompas de Falopio durante una ciruga para evitar que el vulo descienda hacia el tero. Esterilizacin histeroscpica En este mtodo, se coloca un implante pequeo y flexible dentro de cada trompa de Falopio. Los implantes hacen que se forme un tejido cicatricial en las trompas de Falopio y que las obstruya para que el espermatozoide no pueda llegar al vulo. El procedimiento demora alrededor de 3 meses para que sea efectivo. Debe utilizarse otro mtodo anticonceptivo durante esos 3 meses. Esterilizacin masculina Este es un procedimiento que consiste en atar los conductos que transportan el esperma (vasectoma). Luego del procedimiento, el hombre puede eyacular lquido (semen). Debe utilizarse otro mtodo anticonceptivo durante 3 meses despus del procedimiento. Mtodos de planificacin natural Planificacin familiar natural En este mtodo, la pareja no tiene sexo durante los das en que la mujer podra quedar  embarazada. Mtodo calendario En este mtodo, la mujer realiza un seguimiento de la duracin de cada ciclo menstrual, identifica los das en los que se puede producir un embarazo y no tiene sexo durante esos das. Mtodo de la ovulacin En este mtodo, la pareja evita tener sexo durante la ovulacin. Mtodo sintotrmico Este mtodo implica no tener sexo durante la ovulacin. Normalmente, la mujer comprueba la ovulacin al observar cambios en su temperatura y en la consistencia del moco cervical. Mtodo posovulacin En este mtodo, la pareja espera a que finalice la ovulacin para tener sexo. Dnde buscar ms informacin  Centers for Disease Control and Prevention (Centros para el Control y la Prevencin de Enfermedades): www.cdc.gov Resumen  La anticoncepcin, o los mtodos anticonceptivos, hace referencia a los mtodos o dispositivos que evitan el embarazo.  Los mtodos anticonceptivos hormonales incluyen implantes, inyecciones, pastillas, parches, anillos vaginales y anticonceptivos de emergencia.  Los mtodos anticonceptivos de barrera pueden incluir condones masculinos, condones femeninos, diafragmas, capuchones cervicales, esponjas y espermicidas.  Existen dos tipos de DIU (dispositivo intrauterino). Un DIU puede colocarse en el tero de una mujer para evitar el embarazo durante 3 a 5 aos.  La esterilizacin permanente puede realizarse mediante un procedimiento tanto en los hombres como en las mujeres. Los mtodos de planificacin familiar natural implican no tener sexo durante los das en que la mujer podra quedar embarazada. Esta informacin no tiene como fin reemplazar el consejo del mdico. Asegrese de hacerle al mdico cualquier pregunta que   tenga. Document Revised: 11/01/2019 Document Reviewed: 11/01/2019 Elsevier Patient Education  2021 [employer] Inc.   Lactancia materna Breastfeeding  Decidir amamantar es una de las mejores elecciones que puede hacer por usted y su  beb. Un cambio en las hormonas durante el embarazo hace que las mamas produzcan leche materna en las glndulas productoras de leche. Las hormonas impiden que la leche materna sea liberada antes del nacimiento del beb. Adems, impulsan el flujo de leche luego del nacimiento. Una vez que ha comenzado a amamantar, pensar en el beb, as como la succin o el llanto, pueden estimular la liberacin de leche de las glndulas productoras de leche. Los beneficios de amamantar Las investigaciones demuestran que la lactancia materna ofrece muchos beneficios de salud para bebs y madres. Adems, ofrece una forma gratuita y conveniente de alimentar al beb. Para el beb  La primera leche (calostro) ayuda a mejorar el funcionamiento del aparato digestivo del beb.  Las clulas especiales de la leche (anticuerpos) ayudan a combatir las infecciones en el beb.  Los bebs que se alimentan con leche materna tambin tienen menos probabilidades de tener asma, alergias, obesidad o diabetes de tipo 2. Adems, tienen menor riesgo de sufrir el sndrome de muerte sbita del lactante (SMSL).  Los nutrientes de la leche materna son mejores para satisfacer las necesidades del beb en comparacin con la leche maternizada.  La leche materna mejora el desarrollo cerebral del beb. Para usted  La lactancia materna favorece el desarrollo de un vnculo muy especial entre la madre y el beb.  Es conveniente. La leche materna es econmica y siempre est disponible a la temperatura correcta.  La lactancia materna ayuda a quemar caloras. Le ayuda a perder el peso ganado durante el embarazo.  Hace que el tero vuelva al tamao que tena antes del embarazo ms rpido. Adems, disminuye el sangrado (loquios) despus del parto.  La lactancia materna contribuye a reducir el riesgo de tener diabetes de tipo 2, osteoporosis, artritis reumatoide, enfermedades cardiovasculares y cncer de mama, ovario, tero y endometrio en el  futuro. Informacin bsica sobre la lactancia Comienzo de la lactancia  Encuentre un lugar cmodo para sentarse o acostarse, con un buen respaldo para el cuello y la espalda.  Coloque una almohada o una manta enrollada debajo del beb para acomodarlo a la altura de la mama (si est sentada). Las almohadas para amamantar se han diseado especialmente a fin de servir de apoyo para los brazos y el beb mientras amamanta.  Asegrese de que la barriga del beb (abdomen) est frente a la suya.  Masajee suavemente la mama. Con las yemas de los dedos, masajee los bordes exteriores de la mama hacia adentro, en direccin al pezn. Esto estimula el flujo de leche. Si la leche fluye lentamente, es posible que deba continuar con este movimiento durante la lactancia.  Sostenga la mama con 4 dedos por debajo y el pulgar por arriba del pezn (forme la letra "C" con la mano). Asegrese de que los dedos se encuentren lejos del pezn y de la boca del beb.  Empuje suavemente los labios del beb con el pezn o con el dedo.  Cuando la boca del beb se abra lo suficiente, acrquelo rpidamente a la mama e introduzca todo el pezn y la arola, tanto como sea posible, dentro de la boca del beb. La arola es la zona de color que rodea al pezn. ? Debe haber ms arola visible por arriba del labio superior del beb que por debajo del labio   inferior. ? Los labios del beb deben estar abiertos y extendidos hacia afuera (evertidos) para asegurar que el beb se prenda de forma adecuada y cmoda. ? La lengua del beb debe estar entre la enca inferior y la mama.  Asegrese de que la boca del beb est en la posicin correcta alrededor del pezn (prendido). Los labios del beb deben crear un sello sobre la mama y estar doblados hacia afuera (invertidos).  Es comn que el beb succione durante 2 a 3 minutos para que comience el flujo de leche materna. Cmo debe prenderse Es muy importante que le ensee al beb cmo  prenderse adecuadamente a la mama. Si el beb no se prende adecuadamente, puede causar dolor en los pezones, reducir la produccin de leche materna y hacer que el beb tenga un escaso aumento de peso. Adems, si el beb no se prende adecuadamente al pezn, puede tragar aire durante la alimentacin. Esto puede causarle molestias al beb. Hacer eructar al beb al cambiar de mama puede ayudarlo a liberar el aire. Sin embargo, ensearle al beb cmo prenderse a la mama adecuadamente es la mejor manera de evitar que se sienta molesto por tragar aire mientras se alimenta. Signos de que el beb se ha prendido adecuadamente al pezn  Tironea o succiona de modo silencioso, sin causarle dolor. Los labios del beb deben estar extendidos hacia afuera (evertidos).  Se escucha que traga cada 3 o 4 succiones una vez que la leche ha comenzado a fluir (despus de que se produzca el reflejo de eyeccin de la leche).  Hay movimientos musculares por arriba y por delante de sus odos al succionar. Signos de que el beb no se ha prendido adecuadamente al pezn  Hace ruidos de succin o de chasquido mientras se alimenta.  Siente dolor en los pezones. Si cree que el beb no se prendi correctamente, deslice el dedo en la comisura de la boca y colquelo entre las encas del beb para interrumpir la succin. Intente volver a comenzar a amamantar. Signos de lactancia materna exitosa Signos del beb  El beb disminuir gradualmente el nmero de succiones o dejar de succionar por completo.  El beb se quedar dormido.  El cuerpo del beb se relajar.  El beb retendr una pequea cantidad de leche en la boca.  El beb se desprender solo del pecho. Signos que presenta usted  Las mamas han aumentado la firmeza, el peso y el tamao 1 a 3 horas despus de amamantar.  Estn ms blandas inmediatamente despus de amamantar.  Se producen un aumento del volumen de leche y un cambio en su consistencia y color hacia el  quinto da de lactancia.  Los pezones no duelen, no estn agrietados ni sangran. Signos de que su beb recibe la cantidad de leche suficiente  Mojar por lo menos 1 o 2paales durante las primeras 24horas despus del nacimiento.  Mojar por lo menos 5 o 6paales cada 24horas durante la primera semana despus del nacimiento. La orina debe ser clara o de color amarillo plido a los 5das de vida.  Mojar entre 6 y 8paales cada 24horas a medida que el beb sigue creciendo y desarrollndose.  Defeca por lo menos 3 veces en 24 horas a los 5 das de vida. Las heces deben ser blandas y amarillentas.  Defeca por lo menos 3 veces en 24 horas a los 7 das de vida. Las heces deben ser grumosas y amarillentas.  No registra una prdida de peso mayor al 10% del peso al   nacer durante los primeros 3 das de vida.  Aumenta de peso un promedio de 4 a 7onzas (113 a 198g) por semana despus de los 4 das de vida.  Aumenta de peso, diariamente, de manera uniforme a partir de los 5 das de vida, sin registrar prdida de peso despus de las 2semanas de vida. Despus de alimentarse, es posible que el beb regurgite una pequea cantidad de leche. Esto es normal. Frecuencia y duracin de la lactancia El amamantamiento frecuente la ayudar a producir ms leche y puede prevenir dolores en los pezones y las mamas extremadamente llenas (congestin mamaria). Alimente al beb cuando muestre signos de hambre o si siente la necesidad de reducir la congestin de las mamas. Esto se denomina "lactancia a demanda". Las seales de que el beb tiene hambre incluyen las siguientes:  Aumento del estado de alerta, actividad o inquietud.  Mueve la cabeza de un lado a otro.  Abre la boca cuando se le toca la mejilla o la comisura de la boca (reflejo de bsqueda).  Aumenta las vocalizaciones, tales como sonidos de succin, se relame los labios, emite arrullos, suspiros o chirridos.  Mueve la mano hacia la boca y se chupa  los dedos o las manos.  Est molesto o llora. Evite el uso del chupete en las primeras 4 a 6 semanas despus del nacimiento del beb. Despus de este perodo, podr usar un chupete. Las investigaciones demostraron que el uso del chupete durante el primer ao de vida del beb disminuye el riesgo de tener el sndrome de muerte sbita del lactante (SMSL). Permita que el nio se alimente en cada mama todo lo que desee. Cuando el beb se desprende o se queda dormido mientras se est alimentando de la primera mama, ofrzcale la segunda. Debido a que, con frecuencia, los recin nacidos estn somnolientos las primeras semanas de vida, es posible que deba despertar al beb para alimentarlo. Los horarios de lactancia varan de un beb a otro. Sin embargo, las siguientes reglas pueden servir como gua para ayudarla a garantizar que el beb se alimenta adecuadamente:  Se puede amamantar a los recin nacidos (bebs de 4 semanas o menos de vida) cada 1 a 3 horas.  No deben transcurrir ms de 3 horas durante el da o 5 horas durante la noche sin que se amamante a los recin nacidos.  Debe amamantar al beb un mnimo de 8 veces en un perodo de 24 horas. Extraccin de leche materna La extraccin y el almacenamiento de la leche materna le permiten asegurarse de que el beb se alimente exclusivamente de su leche materna, aun en momentos en los que no puede amamantar. Esto tiene especial importancia si debe regresar al trabajo en el perodo en que an est amamantando o si no puede estar presente en los momentos en que el beb debe alimentarse. Su asesor en lactancia puede ayudarla a encontrar un mtodo de extraccin que funcione mejor para usted y orientarla sobre cunto tiempo es seguro almacenar leche materna.      Cmo cuidar las mamas durante la lactancia Los pezones pueden secarse, agrietarse y doler durante la lactancia. Las siguientes recomendaciones pueden ayudarla a mantener las mamas humectadas y  sanas:  Evite usar jabn en los pezones.  Use un sostn de soporte diseado especialmente para la lactancia materna. Evite usar sostenes con aro o sostenes muy ajustados (sostenes deportivos).  Seque al aire sus pezones durante 3 a 4minutos despus de amamantar al beb.  Utilice solo apsitos de algodn en   el sostn para absorber las prdidas de leche. La prdida de un poco de leche materna entre las tomas es normal.  Utilice lanolina sobre los pezones luego de amamantar. La lanolina ayuda a mantener la humedad normal de la piel. La lanolina pura no es perjudicial (no es txica) para el beb. Adems, puede extraer manualmente algunas gotas de leche materna y masajear suavemente esa leche sobre los pezones para que la leche se seque al aire. Durante las primeras semanas despus del nacimiento, algunas mujeres experimentan congestin mamaria. La congestin mamaria puede hacer que sienta las mamas pesadas, calientes y sensibles al tacto. El pico de la congestin mamaria ocurre en el plazo de los 3 a 5 das despus del parto. Las siguientes recomendaciones pueden ayudarla a aliviar la congestin mamaria:  Vace por completo las mamas al amamantar o extraer leche. Puede aplicar calor hmedo en las mamas (en la ducha o con toallas hmedas para manos) antes de amamantar o extraer leche. Esto aumenta la circulacin y ayuda a que la leche fluya. Si el beb no vaca por completo las mamas cuando lo amamanta, extraiga la leche restante despus de que haya finalizado.  Aplique compresas de hielo sobre las mamas inmediatamente despus de amamantar o extraer leche, a menos que le resulte demasiado incmodo. Haga lo siguiente: ? Ponga el hielo en una bolsa plstica. ? Coloque una toalla entre la piel y la bolsa de hielo. ? Coloque el hielo durante 20minutos, 2 o 3veces por da.  Asegrese de que el beb est prendido y se encuentre en la posicin correcta mientras lo alimenta. Si la congestin mamaria  persiste luego de 48 horas o despus de seguir estas recomendaciones, comunquese con su mdico o un asesor en lactancia. Recomendaciones de salud general durante la lactancia  Consuma 3 comidas y 3 colaciones saludables todos los das. Las madres bien alimentadas que amamantan necesitan entre 450 y 500 caloras adicionales por da. Puede cumplir con este requisito al aumentar la cantidad de una dieta equilibrada que realice.  Beba suficiente agua para mantener la orina clara o de color amarillo plido.  Descanse con frecuencia, reljese y siga tomando sus vitaminas prenatales para prevenir la fatiga, el estrs y los niveles bajos de vitaminas y minerales en el cuerpo (deficiencias de nutrientes).  No consuma ningn producto que contenga nicotina o tabaco, como cigarrillos y cigarrillos electrnicos. El beb puede verse afectado por las sustancias qumicas de los cigarrillos que pasan a la leche materna y por la exposicin al humo ambiental del tabaco. Si necesita ayuda para dejar de fumar, consulte al mdico.  Evite el consumo de alcohol.  No consuma drogas ilegales o marihuana.  Antes de usar cualquier medicamento, hable con el mdico. Estos incluyen medicamentos recetados y de venta libre, como tambin vitaminas y suplementos a base de hierbas. Algunos medicamentos, que pueden ser perjudiciales para el beb, pueden pasar a travs de la leche materna.  Puede quedar embarazada durante la lactancia. Si se desea un mtodo anticonceptivo, consulte al mdico sobre cules son las opciones seguras durante la lactancia. Dnde encontrar ms informacin: Liga internacional La Leche: www.llli.org. Comunquese con un mdico si:  Siente que quiere dejar de amamantar o se siente frustrada con la lactancia.  Sus pezones estn agrietados o sangran.  Sus mamas estn irritadas, sensibles o calientes.  Tiene los siguientes sntomas: ? Dolor en las mamas o en los pezones. ? Un rea hinchada en cualquiera  de las mamas. ? Fiebre o escalofros. ? Nuseas o vmitos. ?   Drenaje de otro lquido distinto de la leche materna desde los pezones.  Sus mamas no se llenan antes de amamantar al beb para el quinto da despus del parto.  Se siente triste y deprimida.  El beb: ? Est demasiado somnoliento como para comer bien. ? Tiene problemas para dormir. ? Tiene ms de 1 semana de vida y moja menos de 6 paales en un periodo de 24 horas. ? No ha aumentado de peso a los 5 das de vida.  El beb defeca menos de 3 veces en 24 horas.  La piel del beb o las partes blancas de los ojos se vuelven amarillentas. Solicite ayuda de inmediato si:  El beb est muy cansado (letargo) y no se quiere despertar para comer.  Le sube la fiebre sin causa. Resumen  La lactancia materna ofrece muchos beneficios de salud para bebs y madres.  Intente amamantar a su beb cuando muestre signos tempranos de hambre.  Haga cosquillas o empuje suavemente los labios del beb con el dedo o el pezn para lograr que el beb abra la boca. Acerque el beb a la mama. Asegrese de que la mayor parte de la arola se encuentre dentro de la boca del beb. Ofrzcale una mama y haga eructar al beb antes de pasar a la otra.  Hable con su mdico o asesor en lactancia si tiene dudas o problemas con la lactancia. Esta informacin no tiene como fin reemplazar el consejo del mdico. Asegrese de hacerle al mdico cualquier pregunta que tenga. Document Revised: 06/25/2017 Document Reviewed: 07/21/2016 Elsevier Patient Education  2021 [employer] Inc.  

## 2020-08-28 NOTE — Progress Notes (Signed)
   Subjective:  Kelli Robbins is a 35 y.o. [phone] at [redacted]w[redacted]d being seen today for ongoing prenatal care.  She is currently monitored for the following issues for this high-risk pregnancy and has Supervision of high risk pregnancy, antepartum; History of preterm delivery, currently pregnant; History of pre-eclampsia in prior pregnancy, currently pregnant; History of cesarean section; Abnormal quad screen; and Gestational diabetes mellitus on their problem list.  Patient reports no complaints.  Contractions: Irritability. Vag. Bleeding: None.  Movement: Present. Denies leaking of fluid.   The following portions of the patient's history were reviewed and updated as appropriate: allergies, current medications, past family history, past medical history, past social history, past surgical history and problem list. Problem list updated.  Objective:   Vitals:   08/28/20 0932  BP: 115/70  Pulse: 80  Weight: 185 lb 6.4 oz (84.1 kg)    Fetal Status: Fetal Heart Rate (bpm): 138   Movement: Present     General:  Alert, oriented and cooperative. Patient is in no acute distress.  Skin: Skin is warm and dry. No rash noted.   Cardiovascular: Normal heart rate noted  Respiratory: Normal respiratory effort, no problems with respiration noted  Abdomen: Soft, gravid, appropriate for gestational age. Pain/Pressure: Absent     Pelvic: Vag. Bleeding: None     Cervical exam deferred        Extremities: Normal range of motion.  Edema: None  Mental Status: Normal mood and affect. Normal behavior. Normal judgment and thought content.   Urinalysis:      Assessment and Plan:  Pregnancy: G3P1102 at [redacted]w[redacted]d  1. Supervision of high risk pregnancy, antepartum BP and FHR normal  2. Insulin controlled gestational diabetes mellitus (GDM) in third trimester On metformin 1g BID and NPH 10u QHS Sugars are again only 60% at goal Patient to increase NPH to 10u BID BPP and f/u growth scheduled for tomorrow  3.  History of preterm delivery, currently pregnant Not on makena  4. History of pre-eclampsia in prior pregnancy, currently pregnant On ASA  5. History of cesarean section X2, scheduled for RCS+BTL on 09/10/2020  6. Abnormal quad screen S/p genetic counseling  Term labor symptoms and general obstetric precautions including but not limited to vaginal bleeding, contractions, leaking of fluid and fetal movement were reviewed in detail with the patient. Please refer to After Visit Summary for other counseling recommendations.  Return in 1 week (on 09/04/2020) for [facility], ob visit.   Venora Maples, MD

## 2020-08-28 NOTE — Progress Notes (Signed)
Interpreter services provided by Clerance Lav [phone] from [employer] video   Patient was seen on 08/28/2020 for follow-up assessment and education for Gestational Diabetes. EDD 09/17/2020; [redacted]w[redacted]d.   Current medication:  Metformin 1000 mg bid NPH 10 units at bedtime  Pt states she hasn't used her insulin last couple of days because she left insulin in a hot car. Although her BG have looked good last couple of days, pt states she wants to get another vial. I didn't know if pharmacy would provide refill this soon or not and told her to ask MD.  Fasting blood sugar varies quite a bit. Stress and pain may explain this. 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 On the days that she worked last week her fasting blood sugars were elevated. Pt reports that her work is stressful. At her last MD visit the blood sugar log was scanned to media file and the days her FBS was elevated she had injured her foot and was in pain.  Pt states she went to the beach for a couple of days and noticed that her blood sugar was improved.  Pt reports on the days postprandial blood sugar went above 130 mg/dL was due to mango or sweet bread. The meals with PPBG in 80-90s range she reports only having 15 g or less of carbohydrate.     The following learning objectives reviewed during follow-up visit:   Importance of self-care  Portion control of sweet bread and mango  Plan:  . Ask for help when that could help reduce your stress level. . Continue eating balanced meals   Patient instructed to monitor glucose levels: FBS: 60 - 95 mg/dl 2 hour: <300 mg/dl  Patient received the following handouts:  none  Patient will be seen for follow-up as needed.

## 2020-08-29 ENCOUNTER — Encounter: Payer: Self-pay | Admitting: *Deleted

## 2020-08-29 ENCOUNTER — Ambulatory Visit: Payer: Self-pay | Admitting: *Deleted

## 2020-08-29 ENCOUNTER — Ambulatory Visit: Payer: Self-pay | Attending: Obstetrics and Gynecology

## 2020-08-29 DIAGNOSIS — Z3A37 37 weeks gestation of pregnancy: Secondary | ICD-10-CM

## 2020-08-29 DIAGNOSIS — O99213 Obesity complicating pregnancy, third trimester: Secondary | ICD-10-CM

## 2020-08-29 DIAGNOSIS — O09299 Supervision of pregnancy with other poor reproductive or obstetric history, unspecified trimester: Secondary | ICD-10-CM | POA: Insufficient documentation

## 2020-08-29 DIAGNOSIS — O09899 Supervision of other high risk pregnancies, unspecified trimester: Secondary | ICD-10-CM | POA: Insufficient documentation

## 2020-08-29 DIAGNOSIS — E669 Obesity, unspecified: Secondary | ICD-10-CM

## 2020-08-29 DIAGNOSIS — O0990 Supervision of high risk pregnancy, unspecified, unspecified trimester: Secondary | ICD-10-CM

## 2020-08-29 DIAGNOSIS — O24415 Gestational diabetes mellitus in pregnancy, controlled by oral hypoglycemic drugs: Secondary | ICD-10-CM

## 2020-08-29 DIAGNOSIS — O09213 Supervision of pregnancy with history of pre-term labor, third trimester: Secondary | ICD-10-CM

## 2020-08-29 DIAGNOSIS — O09293 Supervision of pregnancy with other poor reproductive or obstetric history, third trimester: Secondary | ICD-10-CM

## 2020-08-29 DIAGNOSIS — O34219 Maternal care for unspecified type scar from previous cesarean delivery: Secondary | ICD-10-CM

## 2020-08-29 DIAGNOSIS — O09523 Supervision of elderly multigravida, third trimester: Secondary | ICD-10-CM

## 2020-09-03 ENCOUNTER — Encounter: Payer: Self-pay | Admitting: Obstetrics and Gynecology

## 2020-09-03 ENCOUNTER — Other Ambulatory Visit: Payer: Self-pay

## 2020-09-03 ENCOUNTER — Inpatient Hospital Stay (HOSPITAL_COMMUNITY)
Admission: AD | Admit: 2020-09-03 | Discharge: 2020-09-03 | Disposition: A | Discharge status: Home / Self Care | Payer: Self-pay | Attending: Obstetrics & Gynecology | Admitting: Obstetrics & Gynecology

## 2020-09-03 ENCOUNTER — Ambulatory Visit (INDEPENDENT_AMBULATORY_CARE_PROVIDER_SITE_OTHER): Payer: Self-pay | Admitting: Obstetrics and Gynecology

## 2020-09-03 VITALS — BP 120/75 | HR 82 | Wt 186.7 lb

## 2020-09-03 DIAGNOSIS — O0990 Supervision of high risk pregnancy, unspecified, unspecified trimester: Secondary | ICD-10-CM

## 2020-09-03 DIAGNOSIS — O280 Abnormal hematological finding on antenatal screening of mother: Secondary | ICD-10-CM

## 2020-09-03 DIAGNOSIS — O09899 Supervision of other high risk pregnancies, unspecified trimester: Secondary | ICD-10-CM

## 2020-09-03 DIAGNOSIS — O479 False labor, unspecified: Secondary | ICD-10-CM

## 2020-09-03 DIAGNOSIS — Z98891 History of uterine scar from previous surgery: Secondary | ICD-10-CM

## 2020-09-03 DIAGNOSIS — O09299 Supervision of pregnancy with other poor reproductive or obstetric history, unspecified trimester: Secondary | ICD-10-CM

## 2020-09-03 DIAGNOSIS — O34219 Maternal care for unspecified type scar from previous cesarean delivery: Secondary | ICD-10-CM

## 2020-09-03 DIAGNOSIS — O471 False labor at or after 37 completed weeks of gestation: Secondary | ICD-10-CM | POA: Insufficient documentation

## 2020-09-03 DIAGNOSIS — Z3A38 38 weeks gestation of pregnancy: Secondary | ICD-10-CM | POA: Insufficient documentation

## 2020-09-03 DIAGNOSIS — O24414 Gestational diabetes mellitus in pregnancy, insulin controlled: Secondary | ICD-10-CM

## 2020-09-03 NOTE — Discharge Instructions (Signed)
Rosen's Emergency Medicine: Concepts and Clinical Practice (9th ed., pp. 2296- 2312). Elsevier.">  Contracciones de [employer] Braxton [employer] Las contracciones del tero pueden presentarse durante todo el [place], [place] no siempre indican que la mujer est de [place]. Es posible que usted haya tenido contracciones de prctica llamadas "contracciones de [place]". A veces, se las confunde con el parto real. Qu son las contracciones de [place]? Las contracciones de [place] son espasmos que se producen en los msculos del tero antes del [place]. A diferencia de las contracciones del parto verdadero, estas no producen el agrandamiento (la dilatacin) ni el afinamiento del cuello uterino. Hacia el final del embarazo [facility] las semanas [phone]), las contracciones de Braxton Hicks pueden presentarse ms seguido y tornarse ms intensas. A veces, resulta difcil distinguirlas del parto verdadero porque pueden ser [employer]. No debe sentirse avergonzada si concurre al hospital con falso parto. En ocasiones, la nica forma de saber si el trabajo de parto es verdadero es que el mdico determine si hay cambios en el cuello del tero. El [employer] har un examen fsico y Armed forces operational officer controle las contracciones. Si usted no est de Network engineer, el examen debe indicar que el cuello uterino no est dilatado y que usted no ha roto Financial trader. Si no hay otros problemas de salud asociados con su embarazo, no habr inconvenientes si la envan a su casa con un falso parto. Es posible que las contracciones de Braxton Hicks continen hasta que se desencadene el parto verdadero. Cmo diferenciar el [place] de parto falso del verdadero Trabajo de parto verdadero  Las contracciones [place].  Las contracciones pueden tornarse muy regulares.  La molestia generalmente se siente en la parte superior del tero y se extiende hacia la zona baja del abdomen y [employer] cintura.  Las  contracciones no desaparecen cuando usted camina.  Las contracciones generalmente se hacen ms intensas y Copywriter, advertising.  El cuello uterino se dilata y se afina. Parto falso  En general, las contracciones son ms cortas y no tan intensas como las del parto verdadero.  En general, las contracciones son irregulares.  A menudo, las contracciones se sienten en la parte delantera de la parte baja del abdomen y en la ingle.  Las Physiological scientist cuando usted camina o [place] de posicin mientras est [place].  Las contracciones se vuelven ms dbiles y su duracin es menor a medida que transcurre [employer].  En general, el cuello uterino no se dilata ni se afina. Siga estas indicaciones en su casa:  Tome los medicamentos de venta libre y los recetados solamente como se lo haya indicado el mdico.  Contine haciendo los ejercicios habituales y siga las dems indicaciones que el mdico le d.  Coma y beba con moderacin si cree que est de parto.  Si las contracciones de [employer] provocan incomodidad: ? Cambie de posicin: si est acostada o descansando, camine; si est caminando, descanse. ? Sintese y descanse en una baera con agua tibia. ? Beba suficiente lquido como para mantener la orina de color amarillo plido. La deshidratacin puede provocar contracciones. ? Respire lenta y profundamente varias veces por hora.  Vaya a todas las visitas de control prenatales y de control como se lo haya indicado el mdico. Esto es importante.   Comunquese con un mdico si:  Tiene fiebre.  Siente dolor constante en el abdomen. Solicite ayuda de inmediato si:  Las contracciones se intensifican, se hacen ms regulares  y cercanas entre sí. °· Tiene una pérdida de líquido por la vagina. °· Elimina una mucosidad sanguinolenta (pérdida del tapón mucoso). °· Tiene una hemorragia vaginal. °· Tiene un dolor en la zona lumbar que nunca tuvo antes. °· Siente que la  cabeza del bebé empuja hacia abajo y ejerce presión en la zona pélvica. °· El bebé no se mueve tanto como antes. °Resumen °· Las contracciones que se presentan antes del parto se conocen como contracciones de Braxton Hicks, falso parto o contracciones de práctica. °· En general, las contracciones de Braxton Hicks son más cortas, más débiles, con más tiempo entre una y otra, y menos regulares que las contracciones del parto verdadero. Las contracciones del parto verdadero se intensifican progresivamente y se tornan regulares y más frecuentes. °· Para controlar la molestia que producen las contracciones de Braxton Hicks, puede cambiar de posición, darse un baño templado y descansar, beber mucha agua o practicar la respiración profunda. °Esta información no tiene como fin reemplazar el consejo del médico. Asegúrese de hacerle al médico cualquier pregunta que tenga. °Document Revised: 07/10/2017 Document Reviewed: 11/10/2016 °Elsevier Patient Education © 2021 Elsevier Inc. ° °

## 2020-09-03 NOTE — MAU Note (Signed)
Pt reports she started having some spotting during the night. Reports cts on and off 10-30 min apart. Good fetal movement felt . Denies any leaking.

## 2020-09-03 NOTE — Patient Instructions (Signed)
Parto por cesrea, cuidados posteriores Cesarean Delivery, Care After Esta hoja le brinda informacin sobre cmo cuidarse despus del procedimiento. El mdico tambin podr darle indicaciones ms especficas. Comunquese con el mdico si tiene problemas o preguntas. Qu puedo esperar despus del procedimiento? Despus del procedimiento, es comn tener los siguientes sntomas:  Una pequea cantidad de sangre o de lquido transparente que proviene de la incisin.  Enrojecimiento, hinchazn y dolor en la zona de la incisin.  Dolor y molestia abdominales.  Hemorragia vaginal (loquios). Aunque no haya tenido parto vaginal, tendr sangrado y secrecin vaginal.  Calambres plvicos.  Fatiga. Es posible que sienta dolor, hinchazn y molestias en el tejido que se encuentra entre la vagina y el ano (perineo) en los siguientes casos:  Si la cesrea no fue planificada y le permitieron realizar el trabajo de parto y pujar.  Si le hicieron una incisin en la zona (episiotoma) o el tejido se desgarr durante el intento de parto vaginal. Siga estas indicaciones en su casa: Cuidados de la incisin  Siga las indicaciones del mdico acerca del cuidado de la incisin. Asegrese de hacer lo siguiente: ? Lvese las manos con agua y jabn antes de cambiar las vendas (vendaje). Use desinfectante para manos si no dispone de agua y jabn. ? Si tiene un vendaje, cmbielo o quteselo siguiendo las indicaciones del mdico. ? No retire los puntos (suturas), las grapas cutneas, la goma para cerrar la piel o las tiras adhesivas. Es posible que estos cierres cutneos deban permanecer en la piel durante 2semanas o ms tiempo. Si los bordes de las tiras adhesivas empiezan a despegarse y enroscarse, puede recortar los que estn sueltos. No retire las tiras adhesivas por completo a menos que el mdico se lo indique.  Controle todos los das la zona de la incisin para detectar signos de infeccin. Est atento a los  siguientes signos: ? Aumento del enrojecimiento, la hinchazn o el dolor. ? Ms lquido o sangre. ? Calor. ? Pus o mal olor.  No tome baos de inmersin, no nade ni use un jacuzzi hasta que el mdico la autorice. Pregntele al mdico si puede ducharse.  Cuando tosa o estornude, abrace una almohada. Esto ayuda con el dolor y disminuye la posibilidad de que su incisin se abra (dehiscencia). Haga esto hasta que la incisin cicatrice.   Medicamentos  Tome los medicamentos de venta libre y los recetados solamente como se lo haya indicado el mdico.  Si le recetaron un antibitico, tmelo como se lo haya indicado el mdico. No deje de tomar el antibitico aunque comience a sentirse mejor.  No conduzca ni use maquinaria pesada mientras toma analgsicos recetados. Estilo de vida  No beba alcohol. Esto es de suma importancia si est amamantando o toma analgsicos.  No consuma ningn producto que contenga nicotina o tabaco, como cigarrillos, cigarrillos electrnicos y tabaco de mascar. Si necesita ayuda para dejar de fumar, consulte al mdico. Comida y bebida  Beba al menos 8vasos de ochoonzas (240cc) de agua todos los das a menos que el mdico le indique lo contrario. Si amamanta, quiz deba beber an ms cantidad de agua.  Coma alimentos ricos en fibras todos los das. Estos alimentos pueden ayudar a prevenir o aliviar el estreimiento. Los alimentos ricos en fibra incluyen los siguientes: ? Panes y cereales integrales. ? Arroz integral. ? Frijoles. ? Frutas y verduras frescas. Actividad  Si es posible, pdale a alguien que la ayude a cuidar del beb y con las tareas del hogar durante al   menos algunos das despus de que le den el alta del hospital.  Retome sus actividades normales como se lo haya indicado el mdico. Pregntele al mdico qu actividades son seguras para usted.  Descanse todo lo que pueda. Trate de descansar o tomar una siesta mientras el beb duerme.  No levante  ningn objeto que pese ms de 10libras (4,5kg) o el lmite de peso que le hayan indicado, hasta que el mdico le diga que puede hacerlo.  Hable con el mdico sobre cundo puede retomar la actividad sexual. Esto puede depender de lo siguiente: ? Riesgo de sufrir una infeccin. ? La rapidez con que cicatrice. ? Comodidad y deseo de retomar la actividad sexual.   Indicaciones generales  No use tampones ni se haga duchas vaginales hasta que el mdico la autorice.  Use ropa floja y cmoda y un sostn firme y que le calce bien.  Mantenga el perineo limpio y seco. Cuando vaya al bao, siempre higiencese de adelante hacia atrs.  Si expulsa un cogulo de sangre, gurdelo y llame al mdico para informrselo. No deseche los cogulos de sangre por el inodoro antes de recibir indicaciones del mdico.  Concurra a todas las visitas de seguimiento para usted y el beb, como se lo haya indicado el mdico. Esto es importante. Comunquese con un mdico si:  Tiene los siguientes sntomas: ? Fiebre. ? Secrecin vaginal con mal olor. ? Pus o mal olor en el lugar de la incisin. ? Dificultad o dolor al orinar. ? Aumento o disminucin repentinos de la frecuencia de las deposiciones. ? Aumento del enrojecimiento, la hinchazn o el dolor alrededor de la incisin. ? Aumento del lquido o sangre proveniente de la incisin. ? Erupcin cutnea. ? Nuseas. ? Poco inters o falta de inters en actividades que solan gustarle. ? Dudas sobre su cuidado y el del beb.  Su incisin se siente caliente al tacto.  Siente dolor en las mamas y se ponen rojas o duras.  Siente tristeza o preocupacin de forma inusual.  Vomita.  Elimina un cogulo de sangre grande por la vagina.  Orina ms de lo habitual.  Se siente mareado o aturdido. Solicite ayuda inmediatamente si:  Tiene los siguientes sntomas: ? Dolor que no desaparece o no mejora con medicamentos. ? Dolor en el pecho. ? Dificultad para  respirar. ? Visin borrosa o manchas en la visin. ? Pensamientos de autolesionarse o lesionar al beb. ? Nuevo dolor en el abdomen o en una de las piernas. ? Dolor de cabeza intenso.  Se desmaya.  Tiene una hemorragia tan intensa en la vagina que empapa ms de un apsito en una hora. El sangrado no debe ser ms abundante que el perodo ms intenso que haya tenido. Resumen  Despus del procedimiento, es comn tener dolor en el lugar de la incisin, clicos abdominales, y sangrado vaginal leve.  Controle todos los das la zona de la incisin para detectar signos de infeccin.  Informe al mdico sobre cualquier sntoma inusual.  Concurra a todas las visitas de seguimiento para usted y el beb, como se lo haya indicado el mdico. Esta informacin no tiene como fin reemplazar el consejo del mdico. Asegrese de hacerle al mdico cualquier pregunta que tenga. Document Revised: 11/11/2017 Document Reviewed: 11/11/2017 Elsevier Patient Education  2021 [employer] Inc.  

## 2020-09-03 NOTE — MAU Provider Note (Signed)
  S: [name] is a 35 y.o. [phone] at [redacted]w[redacted]d  who presents to MAU today complaining contractions q 10-30 minutes. She endorses spotting. She denies LOF. She reports normal fetal movement.    O: BP 130/72   Pulse 100   Temp 98.3 F (36.8 C)   Resp 18   Ht 5\' 2"  (1.575 m)   Wt 84.4 kg   LMP 12/12/2019   BMI 34.02 kg/m   Dilation: Closed Effacement (%): 80 Cervical Position: Posterior Station: Ballotable Exam by:: K.Wilson,RN Fetal Monitoring: Baseline: 135 Variability: mod Accelerations: + Decelerations: no Contractions: 6-10  A: SIUP at [redacted]w[redacted]d  False labor Reactive NST Previous CS  P: Discharge home Labor precautions Follow up at [facility] today as scheduled  [facility], CNM 09/03/2020 9:54 AM

## 2020-09-03 NOTE — Progress Notes (Signed)
Subjective:  Kelli Robbins is a 35 y.o. [id number] at [redacted]w[redacted]d being seen today for ongoing prenatal care.  She is currently monitored for the following issues for this high-risk pregnancy and has Supervision of high risk pregnancy, antepartum; History of preterm delivery, currently pregnant; History of pre-eclampsia in prior pregnancy, currently pregnant; History of cesarean section; Abnormal quad screen; and Gestational diabetes mellitus on their problem list.  Patient reports general discomforts of pregnancy. Seen in MAU for fasle labor.  Contractions: Irregular. Vag. Bleeding: Bloody Show.  Movement: Present. Denies leaking of fluid.   The following portions of the patient's history were reviewed and updated as appropriate: allergies, current medications, past family history, past medical history, past social history, past surgical history and problem list. Problem list updated.  Objective:   Vitals:   09/03/20 1331  BP: 120/75  Pulse: 82  Weight: 186 lb 11.2 oz (84.7 kg)    Fetal Status: Fetal Heart Rate (bpm): 143   Movement: Present     General:  Alert, oriented and cooperative. Patient is in no acute distress.  Skin: Skin is warm and dry. No rash noted.   Cardiovascular: Normal heart rate noted  Respiratory: Normal respiratory effort, no problems with respiration noted  Abdomen: Soft, gravid, appropriate for gestational age. Pain/Pressure: Present     Pelvic:  Cervical exam deferred        Extremities: Normal range of motion.  Edema: Trace  Mental Status: Normal mood and affect. Normal behavior. Normal judgment and thought content.   Urinalysis:      Assessment and Plan:  Pregnancy: G3P1102 at [redacted]w[redacted]d  1. Supervision of high risk pregnancy, antepartum Stable Labor precautions reviewed  2. Insulin controlled gestational diabetes mellitus (GDM) in third trimester CBG's in goal range Continue with current oral and insulin regiment Growth 18 % last week Continue with weekly  antenatal testing   3. History of pre-eclampsia in prior pregnancy, currently pregnant Stable No S/Sx at present  4. History of preterm delivery, currently pregnant Stable  5. Abnormal quad screen stable  6. History of cesarean section For repeat with BTL next Monday  Term labor symptoms and general obstetric precautions including but not limited to vaginal bleeding, contractions, leaking of fluid and fetal movement were reviewed in detail with the patient. Please refer to After Visit Summary for other counseling recommendations.  No follow-ups on file.   Hermina Staggers, MD

## 2020-09-03 NOTE — Progress Notes (Signed)
Pt has paper Gluco log.

## 2020-09-04 ENCOUNTER — Other Ambulatory Visit: Payer: Self-pay

## 2020-09-04 ENCOUNTER — Inpatient Hospital Stay (HOSPITAL_COMMUNITY): Payer: Medicaid Other | Admitting: Certified Registered Nurse Anesthetist

## 2020-09-04 ENCOUNTER — Encounter (HOSPITAL_COMMUNITY)
Admission: AD | Disposition: A | Discharge status: Home / Self Care | Payer: Self-pay | Source: Home / Self Care | Attending: Obstetrics & Gynecology

## 2020-09-04 ENCOUNTER — Inpatient Hospital Stay (HOSPITAL_COMMUNITY)
Admission: AD | Admit: 2020-09-04 | Discharge: 2020-09-06 | DRG: 787 | Disposition: A | Discharge status: Home / Self Care | Payer: Medicaid Other | Attending: Obstetrics & Gynecology | Admitting: Obstetrics & Gynecology

## 2020-09-04 ENCOUNTER — Encounter (HOSPITAL_COMMUNITY): Payer: Self-pay | Admitting: Obstetrics & Gynecology

## 2020-09-04 DIAGNOSIS — O99892 Other specified diseases and conditions complicating childbirth: Secondary | ICD-10-CM | POA: Diagnosis present

## 2020-09-04 DIAGNOSIS — D62 Acute posthemorrhagic anemia: Secondary | ICD-10-CM | POA: Diagnosis not present

## 2020-09-04 DIAGNOSIS — Z7982 Long term (current) use of aspirin: Secondary | ICD-10-CM | POA: Diagnosis not present

## 2020-09-04 DIAGNOSIS — Z98891 History of uterine scar from previous surgery: Secondary | ICD-10-CM

## 2020-09-04 DIAGNOSIS — O09299 Supervision of pregnancy with other poor reproductive or obstetric history, unspecified trimester: Secondary | ICD-10-CM

## 2020-09-04 DIAGNOSIS — N736 Female pelvic peritoneal adhesions (postinfective): Secondary | ICD-10-CM | POA: Diagnosis present

## 2020-09-04 DIAGNOSIS — O9081 Anemia of the puerperium: Secondary | ICD-10-CM | POA: Diagnosis not present

## 2020-09-04 DIAGNOSIS — O280 Abnormal hematological finding on antenatal screening of mother: Secondary | ICD-10-CM | POA: Diagnosis present

## 2020-09-04 DIAGNOSIS — Z3A38 38 weeks gestation of pregnancy: Secondary | ICD-10-CM

## 2020-09-04 DIAGNOSIS — O34211 Maternal care for low transverse scar from previous cesarean delivery: Principal | ICD-10-CM | POA: Diagnosis present

## 2020-09-04 DIAGNOSIS — O26893 Other specified pregnancy related conditions, third trimester: Secondary | ICD-10-CM | POA: Diagnosis present

## 2020-09-04 DIAGNOSIS — O99214 Obesity complicating childbirth: Secondary | ICD-10-CM | POA: Diagnosis present

## 2020-09-04 DIAGNOSIS — O24419 Gestational diabetes mellitus in pregnancy, unspecified control: Secondary | ICD-10-CM | POA: Diagnosis present

## 2020-09-04 DIAGNOSIS — O24424 Gestational diabetes mellitus in childbirth, insulin controlled: Secondary | ICD-10-CM | POA: Diagnosis not present

## 2020-09-04 DIAGNOSIS — O0990 Supervision of high risk pregnancy, unspecified, unspecified trimester: Secondary | ICD-10-CM

## 2020-09-04 DIAGNOSIS — Z8632 Personal history of gestational diabetes: Secondary | ICD-10-CM | POA: Diagnosis present

## 2020-09-04 DIAGNOSIS — Z20822 Contact with and (suspected) exposure to covid-19: Secondary | ICD-10-CM | POA: Diagnosis present

## 2020-09-04 DIAGNOSIS — O09899 Supervision of other high risk pregnancies, unspecified trimester: Secondary | ICD-10-CM

## 2020-09-04 LAB — GLUCOSE, CAPILLARY: Glucose-Capillary: 115 mg/dL — ABNORMAL HIGH (ref 70–99)

## 2020-09-04 LAB — CBC
HCT: 39.8 % (ref 36.0–46.0)
Hemoglobin: 13.6 g/dL (ref 12.0–15.0)
MCH: 30.6 pg (ref 26.0–34.0)
MCHC: 34.2 g/dL (ref 30.0–36.0)
MCV: 89.6 fL (ref 80.0–100.0)
Platelets: 343 10*3/uL (ref 150–400)
RBC: 4.44 MIL/uL (ref 3.87–5.11)
RDW: 14 % (ref 11.5–15.5)
WBC: 11.5 10*3/uL — ABNORMAL HIGH (ref 4.0–10.5)
nRBC: 0 % (ref 0.0–0.2)

## 2020-09-04 LAB — RESP PANEL BY RT-PCR (FLU A&B, COVID) ARPGX2
Influenza A by PCR: NEGATIVE
Influenza B by PCR: NEGATIVE
SARS Coronavirus 2 by RT PCR: NEGATIVE

## 2020-09-04 LAB — TYPE AND SCREEN
ABO/RH(D): O POS
Antibody Screen: NEGATIVE

## 2020-09-04 LAB — COMPREHENSIVE METABOLIC PANEL
ALT: 20 U/L (ref 0–44)
AST: 24 U/L (ref 15–41)
Albumin: 3 g/dL — ABNORMAL LOW (ref 3.5–5.0)
Alkaline Phosphatase: 126 U/L (ref 38–126)
Anion gap: 10 (ref 5–15)
BUN: 8 mg/dL (ref 6–20)
CO2: 18 mmol/L — ABNORMAL LOW (ref 22–32)
Calcium: 9 mg/dL (ref 8.9–10.3)
Chloride: 107 mmol/L (ref 98–111)
Creatinine, Ser: 0.55 mg/dL (ref 0.44–1.00)
GFR, Estimated: >60 mL/min (ref >60)
Glucose, Bld: 108 mg/dL — ABNORMAL HIGH (ref 70–99)
Potassium: 3.9 mmol/L (ref 3.5–5.1)
Sodium: 135 mmol/L (ref 135–145)
Total Bilirubin: 0.7 mg/dL (ref 0.3–1.2)
Total Protein: 6.8 g/dL (ref 6.5–8.1)

## 2020-09-04 LAB — RPR: RPR Ser Ql: NONREACTIVE

## 2020-09-04 SURGERY — Surgical Case
Anesthesia: Spinal | Laterality: Bilateral

## 2020-09-04 MED ORDER — DEXAMETHASONE SODIUM PHOSPHATE 10 MG/ML IJ SOLN
INTRAMUSCULAR | Status: DC | PRN
  Administered 2020-09-04: 5 mg via INTRAVENOUS

## 2020-09-04 MED ORDER — SODIUM CHLORIDE 0.9 % IR SOLN
Status: DC | PRN
  Administered 2020-09-04 (×3): 1

## 2020-09-04 MED ORDER — DEXAMETHASONE SODIUM PHOSPHATE 10 MG/ML IJ SOLN
INTRAMUSCULAR | Status: AC
  Filled 2020-09-04: qty 1

## 2020-09-04 MED ORDER — PROMETHAZINE HCL 25 MG/ML IJ SOLN
INTRAMUSCULAR | Status: AC
  Filled 2020-09-04: qty 1

## 2020-09-04 MED ORDER — SIMETHICONE 80 MG PO CHEW
80.0000 mg | CHEWABLE_TABLET | Freq: Three times a day (TID) | ORAL | Status: DC
  Administered 2020-09-04 – 2020-09-05 (×4): 80 mg via ORAL
  Filled 2020-09-04 (×4): qty 1

## 2020-09-04 MED ORDER — OXYTOCIN-SODIUM CHLORIDE 30-0.9 UT/500ML-% IV SOLN
INTRAVENOUS | Status: AC
  Filled 2020-09-04: qty 500

## 2020-09-04 MED ORDER — FENTANYL CITRATE (PF) 100 MCG/2ML IJ SOLN
INTRAMUSCULAR | Status: AC
  Filled 2020-09-04: qty 2

## 2020-09-04 MED ORDER — OXYTOCIN-SODIUM CHLORIDE 30-0.9 UT/500ML-% IV SOLN
INTRAVENOUS | Status: DC | PRN
  Administered 2020-09-04: 30 [IU] via INTRAVENOUS

## 2020-09-04 MED ORDER — LACTATED RINGERS IV SOLN: INTRAVENOUS | Status: DC

## 2020-09-04 MED ORDER — ONDANSETRON HCL 4 MG/2ML IJ SOLN
INTRAMUSCULAR | Status: AC
  Filled 2020-09-04: qty 2

## 2020-09-04 MED ORDER — SCOPOLAMINE 1 MG/3DAYS TD PT72
1.0000 | MEDICATED_PATCH | Freq: Once | TRANSDERMAL | Status: AC
Start: 2020-09-04 — End: 2020-09-04
  Administered 2020-09-04: 1.5 mg via TRANSDERMAL
  Administered 2020-09-04: 1 via TRANSDERMAL
  Filled 2020-09-04: qty 1

## 2020-09-04 MED ORDER — NALOXONE HCL 4 MG/10ML IJ SOLN
1.0000 ug/kg/h | INTRAVENOUS | Status: DC | PRN
  Filled 2020-09-04: qty 5

## 2020-09-04 MED ORDER — MENTHOL 3 MG MT LOZG: 1.0000 | LOZENGE | OROMUCOSAL | Status: DC | PRN

## 2020-09-04 MED ORDER — COCONUT OIL OIL
1.0000 | TOPICAL_OIL | Status: DC | PRN
Start: 2020-09-04 — End: 2020-09-06

## 2020-09-04 MED ORDER — DIBUCAINE (PERIANAL) 1 % EX OINT: 1.0000 "application " | TOPICAL_OINTMENT | CUTANEOUS | Status: DC | PRN

## 2020-09-04 MED ORDER — SODIUM CHLORIDE 0.9 % IV SOLN: INTRAVENOUS | Status: DC | PRN

## 2020-09-04 MED ORDER — NALOXONE HCL 0.4 MG/ML IJ SOLN: 0.4000 mg | INTRAMUSCULAR | Status: DC | PRN

## 2020-09-04 MED ORDER — KETOROLAC TROMETHAMINE 30 MG/ML IJ SOLN: 30.0000 mg | Freq: Four times a day (QID) | INTRAMUSCULAR | Status: AC | PRN

## 2020-09-04 MED ORDER — DIPHENHYDRAMINE HCL 25 MG PO CAPS: 25.0000 mg | ORAL_CAPSULE | ORAL | Status: DC | PRN

## 2020-09-04 MED ORDER — SCOPOLAMINE 1 MG/3DAYS TD PT72: 1.0000 | MEDICATED_PATCH | Freq: Once | TRANSDERMAL | Status: DC

## 2020-09-04 MED ORDER — FENTANYL CITRATE (PF) 100 MCG/2ML IJ SOLN
INTRAMUSCULAR | Status: DC | PRN
  Administered 2020-09-04: 15 ug via INTRATHECAL

## 2020-09-04 MED ORDER — KETOROLAC TROMETHAMINE 30 MG/ML IJ SOLN
30.0000 mg | Freq: Four times a day (QID) | INTRAMUSCULAR | Status: AC | PRN
  Administered 2020-09-04: 30 mg via INTRAVENOUS

## 2020-09-04 MED ORDER — KETOROLAC TROMETHAMINE 30 MG/ML IJ SOLN
30.0000 mg | Freq: Four times a day (QID) | INTRAMUSCULAR | Status: AC
  Administered 2020-09-04 – 2020-09-05 (×4): 30 mg via INTRAVENOUS
  Filled 2020-09-04 (×4): qty 1

## 2020-09-04 MED ORDER — CEFAZOLIN SODIUM-DEXTROSE 2-4 GM/100ML-% IV SOLN
2.0000 g | INTRAVENOUS | Status: AC
  Administered 2020-09-04: 2 g via INTRAVENOUS
  Filled 2020-09-04: qty 100

## 2020-09-04 MED ORDER — ONDANSETRON HCL 4 MG/2ML IJ SOLN: 4.0000 mg | Freq: Three times a day (TID) | INTRAMUSCULAR | Status: DC | PRN

## 2020-09-04 MED ORDER — ACETAMINOPHEN 500 MG PO TABS
1000.0000 mg | ORAL_TABLET | Freq: Four times a day (QID) | ORAL | Status: DC
  Administered 2020-09-04 – 2020-09-06 (×8): 1000 mg via ORAL
  Filled 2020-09-04 (×7): qty 2

## 2020-09-04 MED ORDER — SENNOSIDES-DOCUSATE SODIUM 8.6-50 MG PO TABS
2.0000 | ORAL_TABLET | Freq: Every day | ORAL | Status: DC
  Administered 2020-09-05: 2 via ORAL
  Filled 2020-09-04: qty 2

## 2020-09-04 MED ORDER — OXYTOCIN-SODIUM CHLORIDE 30-0.9 UT/500ML-% IV SOLN: 2.5000 [IU]/h | INTRAVENOUS | Status: AC

## 2020-09-04 MED ORDER — PROMETHAZINE HCL 25 MG/ML IJ SOLN
6.2500 mg | INTRAMUSCULAR | Status: DC | PRN
  Administered 2020-09-04: 6.25 mg via INTRAVENOUS

## 2020-09-04 MED ORDER — MORPHINE SULFATE (PF) 0.5 MG/ML IJ SOLN
INTRAMUSCULAR | Status: AC
  Filled 2020-09-04: qty 10

## 2020-09-04 MED ORDER — TETANUS-DIPHTH-ACELL PERTUSSIS 5-2.5-18.5 LF-MCG/0.5 IM SUSY: 0.5000 mL | PREFILLED_SYRINGE | Freq: Once | INTRAMUSCULAR | Status: DC

## 2020-09-04 MED ORDER — SOD CITRATE-CITRIC ACID 500-334 MG/5ML PO SOLN
30.0000 mL | ORAL | Status: AC
  Administered 2020-09-04: 30 mL via ORAL
  Filled 2020-09-04: qty 15

## 2020-09-04 MED ORDER — FAMOTIDINE 20 MG PO TABS
20.0000 mg | ORAL_TABLET | Freq: Once | ORAL | Status: AC
  Administered 2020-09-04: 20 mg via ORAL
  Filled 2020-09-04: qty 1

## 2020-09-04 MED ORDER — WITCH HAZEL-GLYCERIN EX PADS: 1.0000 "application " | MEDICATED_PAD | CUTANEOUS | Status: DC | PRN

## 2020-09-04 MED ORDER — PRENATAL MULTIVITAMIN CH
1.0000 | ORAL_TABLET | Freq: Every day | ORAL | Status: DC
  Administered 2020-09-04 – 2020-09-05 (×2): 1 via ORAL
  Filled 2020-09-04 (×2): qty 1

## 2020-09-04 MED ORDER — ONDANSETRON HCL 4 MG/2ML IJ SOLN
INTRAMUSCULAR | Status: DC | PRN
  Administered 2020-09-04: 4 mg via INTRAVENOUS

## 2020-09-04 MED ORDER — DIPHENHYDRAMINE HCL 50 MG/ML IJ SOLN: 12.5000 mg | INTRAMUSCULAR | Status: DC | PRN

## 2020-09-04 MED ORDER — PHENYLEPHRINE HCL (PRESSORS) 10 MG/ML IV SOLN: INTRAVENOUS | Status: DC | PRN

## 2020-09-04 MED ORDER — OXYCODONE HCL 5 MG PO TABS
5.0000 mg | ORAL_TABLET | ORAL | Status: DC | PRN
  Administered 2020-09-05: 5 mg via ORAL
  Filled 2020-09-04: qty 1

## 2020-09-04 MED ORDER — MEPERIDINE HCL 25 MG/ML IJ SOLN: 6.2500 mg | INTRAMUSCULAR | Status: DC | PRN

## 2020-09-04 MED ORDER — BUPIVACAINE IN DEXTROSE 0.75-8.25 % IT SOLN
INTRATHECAL | Status: DC | PRN
  Administered 2020-09-04: 1.6 mL via INTRATHECAL

## 2020-09-04 MED ORDER — FENTANYL CITRATE (PF) 100 MCG/2ML IJ SOLN: 25.0000 ug | INTRAMUSCULAR | Status: DC | PRN

## 2020-09-04 MED ORDER — ACETAMINOPHEN 500 MG PO TABS
1000.0000 mg | ORAL_TABLET | Freq: Four times a day (QID) | ORAL | Status: DC
  Filled 2020-09-04: qty 2

## 2020-09-04 MED ORDER — ENOXAPARIN SODIUM 40 MG/0.4ML IJ SOSY
40.0000 mg | PREFILLED_SYRINGE | INTRAMUSCULAR | Status: DC
  Administered 2020-09-05: 40 mg via SUBCUTANEOUS
  Filled 2020-09-04: qty 0.4

## 2020-09-04 MED ORDER — NALBUPHINE HCL 10 MG/ML IJ SOLN: 5.0000 mg | INTRAMUSCULAR | Status: DC | PRN

## 2020-09-04 MED ORDER — ONDANSETRON HCL 4 MG/2ML IJ SOLN: INTRAMUSCULAR | Status: DC | PRN

## 2020-09-04 MED ORDER — SIMETHICONE 80 MG PO CHEW: 80.0000 mg | CHEWABLE_TABLET | ORAL | Status: DC | PRN

## 2020-09-04 MED ORDER — DIPHENHYDRAMINE HCL 25 MG PO CAPS: 25.0000 mg | ORAL_CAPSULE | Freq: Four times a day (QID) | ORAL | Status: DC | PRN

## 2020-09-04 MED ORDER — PHENYLEPHRINE HCL-NACL 20-0.9 MG/250ML-% IV SOLN
INTRAVENOUS | Status: DC | PRN
  Administered 2020-09-04: 60 ug/min via INTRAVENOUS

## 2020-09-04 MED ORDER — MORPHINE SULFATE (PF) 0.5 MG/ML IJ SOLN
INTRAMUSCULAR | Status: DC | PRN
  Administered 2020-09-04: .15 mg via INTRATHECAL

## 2020-09-04 MED ORDER — SCOPOLAMINE 1 MG/3DAYS TD PT72
MEDICATED_PATCH | TRANSDERMAL | Status: AC
  Filled 2020-09-04: qty 1

## 2020-09-04 MED ORDER — IBUPROFEN 600 MG PO TABS
600.0000 mg | ORAL_TABLET | Freq: Four times a day (QID) | ORAL | Status: DC
  Administered 2020-09-05 – 2020-09-06 (×4): 600 mg via ORAL
  Filled 2020-09-04 (×4): qty 1

## 2020-09-04 MED ORDER — PHENYLEPHRINE HCL-NACL 20-0.9 MG/250ML-% IV SOLN
INTRAVENOUS | Status: AC
  Filled 2020-09-04: qty 250

## 2020-09-04 MED ORDER — KETOROLAC TROMETHAMINE 30 MG/ML IJ SOLN
INTRAMUSCULAR | Status: AC
  Filled 2020-09-04: qty 1

## 2020-09-04 MED ORDER — NALBUPHINE HCL 10 MG/ML IJ SOLN: 5.0000 mg | Freq: Once | INTRAMUSCULAR | Status: DC | PRN

## 2020-09-04 MED ORDER — SODIUM CHLORIDE 0.9% FLUSH: 3.0000 mL | INTRAVENOUS | Status: DC | PRN

## 2020-09-04 MED ORDER — POVIDONE-IODINE 10 % EX SWAB: 2.0000 "application " | Freq: Once | CUTANEOUS | Status: DC

## 2020-09-04 MED ORDER — NALBUPHINE HCL 10 MG/ML IJ SOLN
5.0000 mg | Freq: Once | INTRAMUSCULAR | Status: DC | PRN
Start: 2020-09-04 — End: 2020-09-06

## 2020-09-04 SURGICAL SUPPLY — 39 items
BENZOIN TINCTURE PRP APPL 2/3 (GAUZE/BANDAGES/DRESSINGS) ×2 IMPLANT
CLAMP CORD UMBIL (MISCELLANEOUS) IMPLANT
CLOSURE WOUND 1/2 X4 (GAUZE/BANDAGES/DRESSINGS) ×1
DERMABOND ADVANCED (GAUZE/BANDAGES/DRESSINGS) ×4
DERMABOND ADVANCED .7 DNX12 (GAUZE/BANDAGES/DRESSINGS) IMPLANT
DRSG OPSITE POSTOP 4X10 (GAUZE/BANDAGES/DRESSINGS) ×3 IMPLANT
ELECT REM PT RETURN 9FT ADLT (ELECTROSURGICAL) ×3
ELECTRODE REM PT RTRN 9FT ADLT (ELECTROSURGICAL) ×1 IMPLANT
EXTRACTOR VACUUM M CUP 4 TUBE (SUCTIONS) IMPLANT
EXTRACTOR VACUUM M CUP 4' TUBE (SUCTIONS)
GLOVE BIOGEL PI IND STRL 6.5 (GLOVE) ×1 IMPLANT
GLOVE BIOGEL PI IND STRL 7.0 (GLOVE) ×1 IMPLANT
GLOVE BIOGEL PI INDICATOR 6.5 (GLOVE) ×2
GLOVE BIOGEL PI INDICATOR 7.0 (GLOVE) ×2
GLOVE SURG SS PI 6.0 STRL IVOR (GLOVE) ×3 IMPLANT
GOWN STRL REUS W/TWL LRG LVL3 (GOWN DISPOSABLE) ×6 IMPLANT
HEMOSTAT ARISTA ABSORB 3G PWDR (HEMOSTASIS) ×4 IMPLANT
KIT ABG SYR 3ML LUER SLIP (SYRINGE) IMPLANT
NDL HYPO 25X5/8 SAFETYGLIDE (NEEDLE) IMPLANT
NEEDLE HYPO 25X5/8 SAFETYGLIDE (NEEDLE) IMPLANT
NS IRRIG 1000ML POUR BTL (IV SOLUTION) ×3 IMPLANT
PACK C SECTION WH (CUSTOM PROCEDURE TRAY) ×3 IMPLANT
PAD OB MATERNITY 4.3X12.25 (PERSONAL CARE ITEMS) ×3 IMPLANT
PENCIL SMOKE EVAC W/HOLSTER (ELECTROSURGICAL) ×3 IMPLANT
RTRCTR C-SECT PINK 25CM LRG (MISCELLANEOUS) IMPLANT
SEPRAFILM MEMBRANE 5X6 (MISCELLANEOUS) IMPLANT
STRIP CLOSURE SKIN 1/2X4 (GAUZE/BANDAGES/DRESSINGS) ×1 IMPLANT
SUT MNCRL 0 VIOLET CTX 36 (SUTURE) IMPLANT
SUT MON AB 3-0 SH 27 (SUTURE) ×2
SUT MON AB 3-0 SH27 (SUTURE) IMPLANT
SUT MONOCRYL 0 CTX 36 (SUTURE) ×8
SUT PLAIN 0 NONE (SUTURE) ×3 IMPLANT
SUT VIC AB 0 CT1 36 (SUTURE) ×12 IMPLANT
SUT VIC AB 0 CTX 36 (SUTURE) ×2
SUT VIC AB 0 CTX36XBRD ANBCTRL (SUTURE) IMPLANT
SUT VIC AB 4-0 KS 27 (SUTURE) ×3 IMPLANT
TOWEL OR 17X24 6PK STRL BLUE (TOWEL DISPOSABLE) ×3 IMPLANT
TRAY FOLEY W/BAG SLVR 14FR LF (SET/KITS/TRAYS/PACK) ×3 IMPLANT
WATER STERILE IRR 1000ML POUR (IV SOLUTION) ×3 IMPLANT

## 2020-09-04 NOTE — Transfer of Care (Signed)
Immediate Anesthesia Transfer of Care Note  Patient: Kelli Robbins  Procedure(s) Performed: CESAREAN SECTION WITH BILATERAL TUBAL LIGATION (Bilateral )  Patient Location: PACU  Anesthesia Type:Spinal  Level of Consciousness: awake, alert  and oriented  Airway & Oxygen Therapy: Patient Spontanous Breathing  Post-op Assessment: Report given to RN and Post -op Vital signs reviewed and stable  Post vital signs: Reviewed and stable  Last Vitals:  Vitals Value Taken Time  BP 106/60 09/04/20 0708  Temp    Pulse 79 09/04/20 0712  Resp 13 09/04/20 0712  SpO2 99 % 09/04/20 0712  Vitals shown include unvalidated device data.  Last Pain:  Vitals:   09/04/20 0129  TempSrc: Oral  PainSc:          Complications: No complications documented.

## 2020-09-04 NOTE — Anesthesia Preprocedure Evaluation (Signed)
Anesthesia Evaluation  Patient identified by MRN, date of birth, ID band Patient awake    Reviewed: Allergy & Precautions, NPO status , Patient's Chart, lab work & pertinent test results  Airway Mallampati: III  TM Distance: >3 FB Neck ROM: Full    Dental  (+) Teeth Intact, Dental Advisory Given   Pulmonary neg pulmonary ROS,    Pulmonary exam normal breath sounds clear to auscultation       Cardiovascular hypertension, negative cardio ROS Normal cardiovascular exam Rhythm:Regular Rate:Normal     Neuro/Psych negative neurological ROS     GI/Hepatic negative GI ROS, Neg liver ROS,   Endo/Other  diabetes, Gestational, Oral Hypoglycemic Agents, Insulin DependentObesity   Renal/GU negative Renal ROS     Musculoskeletal negative musculoskeletal ROS (+)   Abdominal   Peds  Hematology negative hematology ROS (+)   Anesthesia Other Findings Day of surgery medications reviewed with the patient.  Reproductive/Obstetrics (+) Pregnancy REPEAT CESAREAN S,ECTION, undesired fertility                             Anesthesia Physical Anesthesia Plan  ASA: III  Anesthesia Plan: Spinal   Post-op Pain Management:    Induction:   PONV Risk Score and Plan: 2 and Dexamethasone, Ondansetron and Scopolamine patch - Pre-op  Airway Management Planned: Natural Airway and Nasal Cannula  Additional Equipment:   Intra-op Plan:   Post-operative Plan:   Informed Consent: I have reviewed the patients History and Physical, chart, labs and discussed the procedure including the risks, benefits and alternatives for the proposed anesthesia with the patient or authorized representative who has indicated his/her understanding and acceptance.     Dental advisory given and Interpreter used for interveiw  Plan Discussed with: CRNA, Anesthesiologist and Surgeon  Anesthesia Plan Comments: (Discussed risks and  benefits of and differences between spinal and general. Discussed risks of spinal including headache, backache, failure, bleeding, infection, and nerve damage. Patient consents to spinal. Questions answered. Coagulation studies and platelet count acceptable.)        Anesthesia Quick Evaluation

## 2020-09-04 NOTE — Op Note (Signed)
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 [phone] with an IUP [redacted]w[redacted]d presenting in latent labor with history of 2 prior [place]arean deliveries in [place]  The risks, benefits, complications, treatment options, and expected outcomes were discussed with the patient . The patient concurred with the proposed plan, giving informed consent. identified as Eli Hose and the procedure verified as C-Section Delivery.  Procedure Details: A Time Out was held and the above information confirmed.  The patient was taken back to the operative suite where spinal anesthesia was placed.  After induction of anesthesia, the patient was draped and prepped in the usual sterile manner and placed in a dorsal supine position with a leftward tilt. A low transverse incision was made slightly superior to patient's prior incision and carried down through the subcutaneous tissue to the fascia with use of Bovie and blunt dissection. Fascial incision was made and extended transversely bluntly and sharply with use of Mayos. The fascia was separated from the underlying rectus tissue superiorly and inferiorly. Of note, dense adhesions were present along the lower uterine segment and right  adnexa; unable to visualize right fallopian tube and ovary. The peritoneum was identified. Given dense adhesions, peritoneum was elevated and entered sharply and extended bluntly. The utero-vesical peritoneal reflection was incised transversely and the bladder flap was bluntly freed from the lower uterine segment. A low transverse uterine incision was made. Delivered from cephalic presentation was a 2715 gram viable female with Apgar scores of 10 at one minute and 10 at five minutes. Arterial cord gas was sent (pH 7.24); the umbilical cord was clamped and cut cord blood was obtained for evaluation. The placenta was removed Intact and appeared normal. The uterine outline appeared normal but visualization of the tubes and ovaries bilaterally was limited by severe adhesions. A bladder blade was inserted; the uterine incision was closed with running locked sutures of 0 Monocryl. Additional 0 Monocryl sutures (both interrupted and running) were utilized to achieve hemostasis. Arista was applied to site of hysterotomy with good hemostasis observed throughout. Lavage was carried out until clear. The fascia was then reapproximated with running sutures of 0 Vicryl. The subcutaneous layer as irrigated with good hemostasis noted. The skin was closed with 4-0 Vicryl.   Instrument, sponge, and needle counts were correct prior the abdominal closure and were correct at the conclusion of the case.   Disposition: PACU - hemodynamically stable.   Maternal Condition: stable   Signed: Richardine Service 09/04/2020 7:02 AM

## 2020-09-04 NOTE — Anesthesia Procedure Notes (Signed)
Spinal  Patient location during procedure: OR Start time: 09/04/2020 5:22 AM End time: 09/04/2020 5:25 AM Reason for block: surgical anesthesia Staffing Performed: anesthesiologist  Anesthesiologist: Cecile Hearing, MD Preanesthetic Checklist Completed: patient identified, IV checked, risks and benefits discussed, surgical consent, monitors and equipment checked, pre-op evaluation and timeout performed Spinal Block Patient position: sitting Prep: DuraPrep and site prepped and draped Patient monitoring: continuous pulse ox and blood pressure Approach: midline Location: L3-4 Injection technique: single-shot Needle Needle type: Pencan  Needle gauge: 24 G Assessment Sensory level: T6 Events: CSF return Additional Notes Functioning IV was confirmed and monitors were applied. Sterile prep and drape, including hand hygiene, mask and sterile gloves were used. The patient was positioned and the spine was prepped. The skin was anesthetized with lidocaine.  Free flow of clear CSF was obtained prior to injecting local anesthetic into the CSF.  The spinal needle aspirated freely following injection.  The needle was carefully withdrawn.  The patient tolerated the procedure well. Consent was obtained prior to procedure with all questions answered and concerns addressed. Risks including but not limited to bleeding, infection, nerve damage, paralysis, failed block, inadequate analgesia, allergic reaction, high spinal, itching and headache were discussed and the patient wished to proceed.   Kelli Aran, MD

## 2020-09-04 NOTE — Lactation Note (Signed)
This note was copied from a baby's chart. Lactation Consultation Note  Patient Name: Kelli Robbins Today's Date: 09/04/2020 Reason for consult: Initial assessment;Early term 37-38.6wks Age:36 hours  Consult was done in Spanish: Initial visit to 7 hours old infant of a P3 mother. Mother is experienced breastfeeding once mature milk comes into volume. Mother plans to supplement with formula as needed. LC assisted with support pillows, hand expression and latch. Infant is still breastfeeding upon LC leaving.   Plan: 1-Skin to skin 2-Aim for a deep, comfortable latch 3-Breastfeeding on demand or 8-12 times in 24h period. 4-Keep infant awake during breastfeeding session: massaging breast, infant's hand/shoulder/feet 5-Monitor voids and stools as signs good intake.  6-Encouraged maternal rest, hydration and food intake.  7-Contact LC as needed for feeds/support/concerns/questions   All questions answered at this time. Provided Lactation services brochure and promoted [employer] booklet information.  Maternal Data Has patient been taught Hand Expression?: Yes Does the patient have breastfeeding experience prior to this delivery?: Yes How long did the patient breastfeed?: 24 months x2  Feeding Mother's Current Feeding Choice: Breast Milk and Formula Nipple Type: Slow - flow  LATCH Score Latch: Grasps breast easily, tongue down, lips flanged, rhythmical sucking. (LC prompted nipple with tea-cup hold only)  Audible Swallowing: Spontaneous and intermittent  Type of Nipple: Everted at rest and after stimulation  Comfort (Breast/Nipple): Soft / non-tender  Hold (Positioning): No assistance needed to correctly position infant at breast.  LATCH Score: 10   Lactation Tools Discussed/Used Tools: Pump Breast pump type: Manual Pump Education: Setup, frequency, and cleaning;Milk Storage Reason for Pumping: stimulation and supplementation Pumping frequency: as needed Pumped volume:   (has not started yet)  Interventions Interventions: Breast feeding basics reviewed;Assisted with latch;Skin to skin;Breast massage;Hand express;Breast compression;Support pillows;Expressed milk;Hand pump;Education  Discharge Pump: Manual WIC Program: Yes  Consult Status Consult Status: Follow-up Date: 09/05/20 Follow-up type: In-patient    Harjit Douds A Higuera Ancidey 09/04/2020, 1:48 PM

## 2020-09-04 NOTE — Discharge Summary (Addendum)
Postpartum Discharge Summary      Patient Name: Kelli Robbins DOB: [birth year] MRN: [id number]  Date of admission: 09/04/2020 Delivery date:09/04/2020  Delivering provider: Florian Buff  Date of discharge: 09/06/2020  Admitting diagnosis: History of cesarean section [Z98.891] Intrauterine pregnancy: [redacted]w[redacted]d    Secondary diagnosis:  Principal Problem:   Cesarean delivery delivered Active Problems:   History of preterm delivery, currently pregnant   History of pre-eclampsia in prior pregnancy, currently pregnant   History of cesarean section   Abnormal quad screen   Gestational diabetes mellitus   Pelvic adhesive disease   Acute blood loss anemia  Additional problems: as noted above Discharge diagnosis: Cesarean delivery delivered, Severe Pelvic Adhesive Disease                                Post partum procedures:none Augmentation: N/A Complications: Unable to perform BTL secondary to severe pelvic adhesive disease  Hospital course: Onset of Labor With Unplanned C/S   35y.o. yo [id number] admitted in Latent Labor on 09/04/2020. The patient went for cesarean section due to history of [place]: Membrane Rupture Time/Date: 5:56 AM ,09/04/2020   Delivery Method:C-Section, Low Transverse  Details of operation can be found in separate operative note. Patient had an uncomplicated postpartum course. Fasting CBG wnl so was not continued on metformin or insulin. Received 1 dose of IV Venofer 500 mg for anemia with Hgb 8.6 (admission Hgb 13.6).  She is ambulating,tolerating a regular diet, passing flatus, and urinating well.  Patient is discharged home in stable condition 09/06/20.  Newborn Data: Birth date:09/04/2020  Birth time:5:56 AM  Gender:Female  Living status:Living  Apgars:10 ,10  Weight:2715 g (5lb 15.8oz)  Magnesium Sulfate received: No BMZ received: No Rhophylac:N/A MMR:N/A T-DaP:Given prenatally Flu:  Yes Transfusion: received IV Venofer 500 mg x1   Physical exam  Vitals:   09/05/20 0005 09/05/20 0500 09/05/20 1349 09/06/20 0628  BP: (!) 105/53 (!) 96/55 (!) 107/53 (!) 105/56  Pulse: 76 76 77 77  Resp: _0 Temp: 97.8 F (36.6 C) 98.3 F (36.8 C) 97.9 F (36.6 C) 97.8 F (36.6 C)  TempSrc: Oral Axillary Oral Oral  SpO2: 99% 100%  100%  Weight:       General: alert Lochia: appropriate Uterine Fundus: firm Incision: honeycomb CDI DVT Evaluation: No significant calf/ankle edema. Labs: Lab Results  Component Value Date   WBC 11.9 (H) 09/05/2020   HGB 8.6 (L) 09/05/2020   HCT 25.4 (L) 09/05/2020   MCV 90.4 09/05/2020   PLT 238 09/05/2020   CMP Latest Ref Rng & Units 09/04/2020  Glucose 70 - 99 mg/dL 108(H)  BUN 6 - 20 mg/dL 8  Creatinine 0.44 - 1.00 mg/dL 0.55  Sodium 135 - 145 mmol/L 135  Potassium 3.5 - 5.1 mmol/L 3.9  Chloride 98 - 111 mmol/L 107  CO2 22 - 32 mmol/L 18(L)  Calcium 8.9 - 10.3 mg/dL 9.0  Total Protein 6.5 - 8.1 g/dL 6.8  Total Bilirubin 0.3 - 1.2 mg/dL 0.7  Alkaline Phos 38 - 126 U/L 126  AST 15 - 41 U/L 24  ALT 0 - 44 U/L 20   Edinburgh Score: Edinburgh Postnatal Depression Scale Screening Tool 09/05/2020  I have been able to laugh and see the funny side of things. 0  I have looked forward with enjoyment to  things. 0  I have blamed myself unnecessarily when things went wrong. 0  I have been anxious or worried for no good reason. 0  I have felt scared or panicky for no good reason. 0  Things have been getting on top of me. 0  I have been so unhappy that I have had difficulty sleeping. 0  I have felt sad or miserable. 0  I have been so unhappy that I have been crying. 0  The thought of harming myself has occurred to me. 0  Edinburgh Postnatal Depression Scale Total 0     After visit meds:  Allergies as of 09/06/2020   No Known Allergies     Medication List    STOP taking these medications   aspirin EC 81 MG tablet   insulin  NPH Human 100 UNIT/ML injection Commonly known as: NOVOLIN N   INSULIN SYRINGE .3CC/31GX5/16" 31G X 5/16" 0.3 ML Misc   metFORMIN 1000 MG tablet Commonly known as: Glucophage     TAKE these medications   acetaminophen 500 MG tablet Commonly known as: TYLENOL Take 2 tablets (1,000 mg total) by mouth every 6 (six) hours.   coconut oil Oil Apply 1 application topically as needed.   Ferrous Fumarate 324 (106 Fe) MG Tabs tablet Commonly known as: HEMOCYTE - 106 mg FE Take 1 tablet (106 mg of iron total) by mouth every other day.   ibuprofen 600 MG tablet Commonly known as: ADVIL Take 1 tablet (600 mg total) by mouth every 6 (six) hours.   oxyCODONE 5 MG immediate release tablet Commonly known as: Oxy IR/ROXICODONE Take 1 tablet (5 mg total) by mouth every 4 (four) hours as needed for moderate pain.   polyethylene glycol 17 g packet Commonly known as: MiraLax Take 17 g by mouth daily as needed for mild constipation.   PRENATAL VITAMIN PO Take 1 tablet by mouth daily.            Discharge Care Instructions  (From admission, onward)         Start     Ordered   09/06/20 0000  Discharge wound care:       Comments: Remove dressing 5-7 days after surgery.   09/06/20 0906           Discharge home in stable condition Infant Feeding: breast & formula Infant Disposition:home with mother Discharge instruction: per After Visit Summary and Postpartum booklet. Activity: Advance as tolerated. Pelvic rest for 6 weeks.  Diet: routine diet Future Appointments: Future Appointments  Date Time Provider [place]  09/07/2020  9:00 AM MC-LD PAT 1 MC-INDC None  09/07/2020 10:00 AM MC-SCREENING MC-SDSC None  09/17/2020  9:00 AM [facility]WOCA NURSE [facility]  10/08/2020  9:35 AM Donnamae Jude, MD [facility]   Follow up Visit: Message sent to [facility] by Dr. Astrid Drafts.  Please schedule this patient for a In person postpartum visit in 6 weeks with the following provider: Any  provider. Additional Postpartum F/U:2 hour GTT, Incision check 1 week and BP check 1 week  High risk pregnancy complicated by: Now h/o Cesarean x3, Severe Pelvic Adhesive Disease, A2GDM, h/o Preeclampsia with prior pregnancy Delivery mode:  C-Section, Low Transverse  Anticipated Birth Control:  Unsure--unable to perform BTL secondary to severe adhesions, planning to go back to coitus interruptus   09/06/2020 Myrtis Ser, CNM  9:16 AM

## 2020-09-04 NOTE — MAU Note (Signed)
PT SAYS WITH INTERPRETER- KAREN  C/S  [facility] FOR ON 5-30.  FEELS [facility] WITH FAMINA  PT SAYS SHE WAS HERE THIS AM - VE - CLOSED

## 2020-09-04 NOTE — H&P (Signed)
OBSTETRIC ADMISSION HISTORY AND PHYSICAL  Kelli Robbins is a 35 y.o. female [phone] with IUP at [redacted]w[redacted]d by LMP presenting for contractions, found to be in latent labor with cervical change noted in MAU. She reports +FMs, No LOF, no VB, no blurry vision, headaches or peripheral edema, and RUQ pain.  She plans on breast and bottle feeding. She requests BTL for birth control. She received her prenatal care at [facility]   Dating: By LMP --->  Estimated Date of Delivery: 09/17/20  Sono:  @[redacted]w[redacted]d , CWD, normal anatomy, cephalic presentation, 2629g, EFW  Prenatal History/Complications:  - A2GDM (insulin and metformin in pregnancy) - H/o Preeclampsia in prior pregnancy - Abnormal quad screen (increased risk of down syndrome; pt declined follow-up testing) - H/o preterm delivery  Past Medical History: Past Medical History:  Diagnosis Date  . Gestational diabetes    insulin  . Gestational hypertension   . Preeclampsia     Past Surgical History: Past Surgical History:  Procedure Laterality Date  . CESAREAN SECTION      Obstetrical History: OB History    Gravida  3   Para  2   Term  1   Preterm  1   AB  0   Living  2     SAB  0   IAB  0   Ectopic  0   Multiple  0   Live Births  2           Social History Social History   Socioeconomic History  . Marital status: Significant Other    Spouse name: Not on file  . Number of children: Not on file  . Years of education: Not on file  . Highest education level: Not on file  Occupational History  . Not on file  Tobacco Use  . Smoking status: Never Smoker  . Smokeless tobacco: Never Used  Vaping Use  . Vaping Use: Never used  Substance and Sexual Activity  . Alcohol use: Never  . Drug use: Never  . Sexual activity: Yes  Other Topics Concern  . Not on file  Social History Narrative  . Not on file   Social Determinants of Health   Financial Resource Strain: Not on file  Food Insecurity: Food Insecurity  Present  . Worried About 50% in the Last Year: Sometimes true  . Ran Out of Food in the Last Year: Never true  Transportation Needs: No Transportation Needs  . Lack of Transportation (Medical): No  . Lack of Transportation (Non-Medical): No  Physical Activity: Not on file  Stress: Not on file  Social Connections: Not on file    Family History: Family History  Problem Relation Age of Onset  . Hypertension Mother   . Asthma Father     Allergies: No Known Allergies  Medications Prior to Admission  Medication Sig Dispense Refill Last Dose  . aspirin EC 81 MG tablet Take 81 mg by mouth daily. Swallow whole.   09/03/2020 at Unknown time  . insulin NPH Human (NOVOLIN N) 100 UNIT/ML injection Inject 0.1 mLs (10 Units total) into the skin 2 (two) times daily before a meal. 10 mL 1 09/03/2020 at Unknown time  . metFORMIN (GLUCOPHAGE) 1000 MG tablet Take 1 tablet (1,000 mg total) by mouth 2 (two) times daily with a meal. 60 tablet 5 09/03/2020 at Unknown time  . Prenatal Vit-Fe Fumarate-FA (PRENATAL VITAMIN PO) Take 1 tablet by mouth daily.   09/03/2020 at Unknown time  .  Insulin Syringe-Needle U-100 (INSULIN SYRINGE .3CC/31GX5/16") 31G X 5/16" 0.3 ML MISC 1 Device by Does not apply route daily. 100 each 1      Review of Systems   All systems reviewed and negative except as stated in HPI  Blood pressure 118/76, pulse 89, temperature 99.2 F (37.3 C), temperature source Oral, resp. rate 20, weight 85.6 kg, last menstrual period 12/12/2019. General appearance: alert, cooperative and appears stated age Lungs: normal WOB Heart: regular rate  Abdomen: soft, non-tender Extremities:no sign of DVT Presentation: cephalic Fetal monitoringBaseline: 140 bpm, Variability: Good {> 6 bpm), Accelerations: Reactive and Decelerations: Absent Uterine activityFrequency: Every 2-4 minutes Dilation: 1 Effacement (%): 80 Station: -2 Exam by:: MARIE, CNM   Prenatal labs: ABO, Rh:  --/--/PENDING (05/24 0400) Antibody: PENDING (05/24 0400) Rubella: Immune (12/20 0000) RPR: Non Reactive (03/15 0847)  HBsAg:    HIV: Non Reactive (03/15 0847)  GBS: Negative/-- (05/12 1552)  2 hr Glucola 101/188/98 Genetic screening: abnormal quad screen (pt declined follow-up testing) Anatomy US: wnl  Prenatal Transfer Tool  Maternal Diabetes: Yes:  Diabetes Type:  Insulin/Medication controlled Genetic Screening: Abnormal:  Results: Elevated risk of Trisomy 21 Maternal Ultrasounds/Referrals: Normal Fetal Ultrasounds or other Referrals:  Referred to Materal Fetal Medicine  Maternal Substance Abuse:  No Significant Maternal Medications: metformin, insulin Significant Maternal Lab Results: Group B Strep negative  Results for orders placed or performed during the hospital encounter of 09/04/20 (from the past 24 hour(s))  Type and screen   Collection Time: 09/04/20  4:00 AM  Result Value Ref Range   ABO/RH(D) PENDING    Antibody Screen PENDING    Sample Expiration      09/07/2020,2359 Performed at [facility], [address], [place], [place] [address]     Patient Active Problem List   Diagnosis Date Noted  . Gestational diabetes mellitus 06/27/2020  . Supervision of high risk pregnancy, antepartum 05/08/2020  . History of preterm delivery, currently pregnant 05/08/2020  . History of pre-eclampsia in prior pregnancy, currently pregnant 05/08/2020  . History of cesarean section 05/08/2020  . Abnormal quad screen 05/08/2020    Assessment/Plan:  Kelli Robbins is a 35 y.o. [id number] at [redacted]w[redacted]d here for latent labor in the setting of Cesarean x2.  #Repeat Cesarean with BTL  H/o Cesarean x2: The risks of cesarean section were discussed with the patient including but were not limited to: bleeding which may require transfusion or reoperation; infection which may require antibiotics; injury to bowel, bladder, ureters or other surrounding organs; injury to the fetus; need  for additional procedures including hysterectomy in the event of a life-threatening hemorrhage; placental abnormalities wth subsequent pregnancies, incisional problems, thromboembolic phenomenon and other postoperative/anesthesia complications.  Patient also desires permanent sterilization.  Other reversible forms of contraception were discussed with patient; she declines all other modalities. Risks of procedure discussed with patient including but not limited to: risk of regret, permanence of method, bleeding, infection, injury to surrounding organs and need for additional procedures.  Failure risk of about 1% with increased risk of ectopic gestation if pregnancy occurs was also discussed with patient.  Also discussed possibility of post-tubal pain syndrome. The patient concurred with the proposed plan, giving informed written consent for the procedures.  Patient has been NPO since 2000 on 09/03/20; she will remain NPO for procedure. Anesthesia and OR aware.  Preoperative prophylactic antibiotics and SCDs ordered on call to the OR.  To OR when ready.  #Pain: Per anesthesia #FWB: Category 1 strip #  ID: GBS negative; plan for ancef prior to OR #MOF: breast & formula #MOC: desires BTL as noted above #Circ: n/a #A1GDM: NPH 10u BID and metformin in pregnancy. Plan for FBG check on PPD#1. #H/o Preeclampsia: blood pressure normal on admission. Will continue to monitor #Abnormal Quad Screen: pt discussed with genetic counseling. Declined follow-up screening. Peds notified.  Sheila Oats, MD OB Fellow, Faculty Practice 09/04/2020 4:34 AM

## 2020-09-05 ENCOUNTER — Other Ambulatory Visit (HOSPITAL_COMMUNITY): Payer: Self-pay

## 2020-09-05 LAB — CBC
HCT: 25.4 % — ABNORMAL LOW (ref 36.0–46.0)
Hemoglobin: 8.6 g/dL — ABNORMAL LOW (ref 12.0–15.0)
MCH: 30.6 pg (ref 26.0–34.0)
MCHC: 33.9 g/dL (ref 30.0–36.0)
MCV: 90.4 fL (ref 80.0–100.0)
Platelets: 238 10*3/uL (ref 150–400)
RBC: 2.81 MIL/uL — ABNORMAL LOW (ref 3.87–5.11)
RDW: 14.2 % (ref 11.5–15.5)
WBC: 11.9 10*3/uL — ABNORMAL HIGH (ref 4.0–10.5)
nRBC: 0 % (ref 0.0–0.2)

## 2020-09-05 LAB — GLUCOSE, CAPILLARY: Glucose-Capillary: 85 mg/dL (ref 70–99)

## 2020-09-05 MED ORDER — SODIUM CHLORIDE 0.9 % IV SOLN
500.0000 mg | Freq: Once | INTRAVENOUS | Status: AC
  Administered 2020-09-05: 500 mg via INTRAVENOUS
  Filled 2020-09-05: qty 25

## 2020-09-05 NOTE — Progress Notes (Signed)
POSTPARTUM PROGRESS NOTE  POD 1  Subjective:  Kelli Robbins is a 35 y.o. [phone] s/p rLTCS at [redacted]w[redacted]d.  No acute events overnight.  Pt denies problems with ambulating, voiding or po intake.  She denies nausea or vomiting.  Pain is well controlled.  She has had flatus. She has not had bowel movement.  Lochia Small.   Objective: Blood pressure (!) 96/55, pulse 76, temperature 98.3 F (36.8 C), temperature source Axillary, resp. rate 17, weight 85.6 kg, last menstrual period 12/12/2019, SpO2 100 %, unknown if currently breastfeeding.  Physical Exam:  General: alert, cooperative and no distress Chest: no respiratory distress Heart:regular rate Abdomen: soft, nontender,  Uterine Fundus: firm, appropriately tender DVT Evaluation: No calf swelling or tenderness Extremities: no edema Skin: warm, dry; honeycomb clean/dry/intact  Recent Labs    09/04/20 0400 09/05/20 0445  HGB 13.6 8.6*  HCT 39.8 25.4*    Assessment/Plan: Kelli Robbins is a 35 y.o. [phone] s/p rLTCS at [redacted]w[redacted]d   POD#1 - Doing well Anemia - Hgb 8.6 this AM down from 13.6, will order IV iron A2GDM - on insulin and metformin during pregnancy, fasting CBG 85 this AM Contraception: BTL unable to be performed due to severe adhesions, will plan to do coitus interruptus instead Feeding: breast and bottle Dispo: Plan for discharge possibly tomorrow.   LOS: 1 day   Littie Deeds MD 09/05/20, 6:06 AM  I supervised the student during this patient encounter. Please see provider note for complete documentation.   Rolm Bookbinder, [place] 09/05/2020 10:18 AM

## 2020-09-05 NOTE — Anesthesia Postprocedure Evaluation (Signed)
Anesthesia Post Note  Patient: Ardra Kuznicki  Procedure(s) Performed: CESAREAN SECTION WITH BILATERAL TUBAL LIGATION (Bilateral )     Patient location during evaluation: PACU Anesthesia Type: Spinal Level of consciousness: oriented, awake and alert and awake Pain management: pain level controlled Vital Signs Assessment: post-procedure vital signs reviewed and stable Respiratory status: spontaneous breathing, respiratory function stable and nonlabored ventilation Cardiovascular status: blood pressure returned to baseline and stable Postop Assessment: no headache, no backache, no apparent nausea or vomiting and patient able to bend at knees Anesthetic complications: no   No complications documented.  Last Vitals:  Vitals:   09/05/20 0005 09/05/20 0500  BP: (!) 105/53 (!) 96/55  Pulse: 76 76  Resp: 17 17  Temp: 36.6 C 36.8 C  SpO2: 99% 100%    Last Pain:  Vitals:   09/05/20 0620  TempSrc:   PainSc: 0-No pain                 Cecile Hearing

## 2020-09-05 NOTE — Lactation Note (Signed)
This note was copied from a baby's chart. Lactation Consultation Note  Patient Name: Kelli Robbins Date: 09/05/2020 Reason for consult: Follow-up assessment;Early term 37-38.6wks;Infant < 6lbs;Infant weight loss Age:37 hours  Visited with mom of 37 hours old ETI female, she's a P3 and experienced BF. Mom was given a hand pump yesterday but hasn't used it yet, her preference is to supplement with formula until her milk comes in and prefers a hand pump instead of a DEBP; she hasn't used it though, pump was sealed, but LC reviewed instructions, cleaning and storage.  Baby asleep when entered the room. Mom told LC that she's been supplementing baby every 3 hours (due to birth weight) but has also been taking her to breast from time time, but she didn't require assistance at this point. Asked mom to call for assistance when needed.  Reviewed normal newborn behavior, feeding cues, cluster feeding, size of baby's stomach, pumping schedule and LPI policy along with supplementation guidelines for LPIs. LC assisted with hand expression and mom was able to get colostrum very easily, praised her for her efforts.  Feeding plan:  1. Encouraged mom to feed baby STS 8-12 times/24 hours or sooner if feeding cues are present 2. Pumping every time baby gets formula was also encouraged to protect her supply 3. Mom will continue supplementing with Similac 20 calorie formula every 3 hours, according to LPI policy per baby's age in hours  This consult was in Spanish which is mom's preferred language. No support person in mom's room at the time of [facility] consultation. Mom reported all questions and concerns were answered, she's aware of LC OP services and will call PRN.  Maternal Data    Feeding Mother's Current Feeding Choice: Breast Milk and Formula  Lactation Tools Discussed/Used Tools: Pump Breast pump type: Manual Pump Education: Setup, frequency, and cleaning;Milk Storage Reason for  Pumping: mother's preference, she wishes to start with a hand pump instead of a DEBP Pumping frequency: every time baby is getting formula  Interventions Interventions: Breast feeding basics reviewed;Hand express;Breast massage;Hand pump;Education  Discharge Pump: Manual  Consult Status Consult Status: Follow-up Date: 09/06/20 Follow-up type: In-patient    Kelli Robbins Venetia Constable 09/05/2020, 7:34 PM

## 2020-09-06 DIAGNOSIS — D62 Acute posthemorrhagic anemia: Secondary | ICD-10-CM | POA: Diagnosis not present

## 2020-09-06 MED ORDER — IBUPROFEN 600 MG PO TABS: 600.0000 mg | ORAL_TABLET | Freq: Four times a day (QID) | ORAL | 0 refills | Status: DC

## 2020-09-06 MED ORDER — POLYETHYLENE GLYCOL 3350 17 G PO PACK: 17.0000 g | PACK | Freq: Every day | ORAL | 0 refills | Status: DC | PRN

## 2020-09-06 MED ORDER — ACETAMINOPHEN 500 MG PO TABS: 1000.0000 mg | ORAL_TABLET | Freq: Four times a day (QID) | ORAL | 0 refills | Status: AC

## 2020-09-06 MED ORDER — FERROUS FUMARATE 324 (106 FE) MG PO TABS: 1.0000 | ORAL_TABLET | ORAL | 3 refills | Status: DC

## 2020-09-06 MED ORDER — OXYCODONE HCL 5 MG PO TABS: 5.0000 mg | ORAL_TABLET | ORAL | 0 refills | Status: DC | PRN

## 2020-09-06 MED ORDER — COCONUT OIL OIL: 1.0000 "application " | TOPICAL_OIL | 0 refills | Status: DC | PRN

## 2020-09-06 NOTE — Progress Notes (Signed)
During rounds, Kelli Robbins was asleep with infant in her arms. RN placed baby in the crib to sleep. RN educated Kelli Robbins on not sleeping with infant in the bed with her around 2050; Kelli Robbins demonstrated understanding. RN will continue to monitor infant and Mother.  

## 2020-09-06 NOTE — Discharge Instructions (Signed)
Parto por cesrea, cuidados posteriores Cesarean Delivery, Care After Esta hoja le brinda informacin sobre cmo cuidarse despus del procedimiento. El mdico tambin podr darle indicaciones ms especficas. Comunquese con el mdico si tiene problemas o preguntas. Qu puedo esperar despus del procedimiento? Despus del procedimiento, es comn tener los siguientes sntomas:  Una pequea cantidad de sangre o de lquido transparente que proviene de la incisin.  Enrojecimiento, hinchazn y dolor en la zona de la incisin.  Dolor y molestia abdominales.  Hemorragia vaginal (loquios). Aunque no haya tenido parto vaginal, tendr sangrado y secrecin vaginal.  Calambres plvicos.  Fatiga. Es posible que sienta dolor, hinchazn y molestias en el tejido que se encuentra entre la vagina y el ano (perineo) en los siguientes casos:  Si la cesrea no fue planificada y le permitieron realizar el trabajo de parto y pujar.  Si le hicieron una incisin en la zona (episiotoma) o el tejido se desgarr durante el intento de parto vaginal. Siga estas indicaciones en su casa: Cuidados de la incisin  Siga las indicaciones del mdico acerca del cuidado de la incisin. Asegrese de hacer lo siguiente: ? Lvese las manos con agua y jabn antes de cambiar las vendas (vendaje). Use desinfectante para manos si no dispone de agua y jabn. ? Si tiene un vendaje, cmbielo o quteselo siguiendo las indicaciones del mdico. ? No retire los puntos (suturas), las grapas cutneas, la goma para cerrar la piel o las tiras adhesivas. Es posible que estos cierres cutneos deban permanecer en la piel durante 2semanas o ms tiempo. Si los bordes de las tiras adhesivas empiezan a despegarse y enroscarse, puede recortar los que estn sueltos. No retire las tiras adhesivas por completo a menos que el mdico se lo indique.  Controle todos los das la zona de la incisin para detectar signos de infeccin. Est atento a los  siguientes signos: ? Aumento del enrojecimiento, la hinchazn o el dolor. ? Ms lquido o sangre. ? Calor. ? Pus o mal olor.  No tome baos de inmersin, no nade ni use un jacuzzi hasta que el mdico la autorice. Pregntele al mdico si puede ducharse.  Cuando tosa o estornude, abrace una almohada. Esto ayuda con el dolor y disminuye la posibilidad de que su incisin se abra (dehiscencia). Haga esto hasta que la incisin cicatrice.   Medicamentos  Tome los medicamentos de venta libre y los recetados solamente como se lo haya indicado el mdico.  Si le recetaron un antibitico, tmelo como se lo haya indicado el mdico. No deje de tomar el antibitico aunque comience a sentirse mejor.  No conduzca ni use maquinaria pesada mientras toma analgsicos recetados. Estilo de vida  No beba alcohol. Esto es de suma importancia si est amamantando o toma analgsicos.  No consuma ningn producto que contenga nicotina o tabaco, como cigarrillos, cigarrillos electrnicos y tabaco de mascar. Si necesita ayuda para dejar de fumar, consulte al mdico. Comida y bebida  Beba al menos 8vasos de ochoonzas (240cc) de agua todos los das a menos que el mdico le indique lo contrario. Si amamanta, quiz deba beber an ms cantidad de agua.  Coma alimentos ricos en fibras todos los das. Estos alimentos pueden ayudar a prevenir o aliviar el estreimiento. Los alimentos ricos en fibra incluyen los siguientes: ? Panes y cereales integrales. ? Arroz integral. ? Frijoles. ? Frutas y verduras frescas. Actividad  Si es posible, pdale a alguien que la ayude a cuidar del beb y con las tareas del hogar durante al   menos algunos das despus de que le den el alta del hospital.  Retome sus actividades normales como se lo haya indicado el mdico. Pregntele al mdico qu actividades son seguras para usted.  Descanse todo lo que pueda. Trate de descansar o tomar una siesta mientras el beb duerme.  No levante  ningn objeto que pese ms de 10libras (4,5kg) o el lmite de peso que le hayan indicado, hasta que el mdico le diga que puede hacerlo.  Hable con el mdico sobre cundo puede retomar la actividad sexual. Esto puede depender de lo siguiente: ? Riesgo de sufrir una infeccin. ? La rapidez con que cicatrice. ? Comodidad y deseo de retomar la actividad sexual.   Indicaciones generales  No use tampones ni se haga duchas vaginales hasta que el mdico la autorice.  Use ropa floja y cmoda y un sostn firme y que le calce bien.  Mantenga el perineo limpio y seco. Cuando vaya al bao, siempre higiencese de adelante hacia atrs.  Si expulsa un cogulo de sangre, gurdelo y llame al mdico para informrselo. No deseche los cogulos de sangre por el inodoro antes de recibir indicaciones del mdico.  Concurra a todas las visitas de seguimiento para usted y el beb, como se lo haya indicado el mdico. Esto es importante. Comunquese con un mdico si:  Tiene los siguientes sntomas: ? Fiebre. ? Secrecin vaginal con mal olor. ? Pus o mal olor en el lugar de la incisin. ? Dificultad o dolor al orinar. ? Aumento o disminucin repentinos de la frecuencia de las deposiciones. ? Aumento del enrojecimiento, la hinchazn o el dolor alrededor de la incisin. ? Aumento del lquido o sangre proveniente de la incisin. ? Erupcin cutnea. ? Nuseas. ? Poco inters o falta de inters en actividades que solan gustarle. ? Dudas sobre su cuidado y el del beb.  Su incisin se siente caliente al tacto.  Siente dolor en las mamas y se ponen rojas o duras.  Siente tristeza o preocupacin de forma inusual.  Vomita.  Elimina un cogulo de sangre grande por la vagina.  Orina ms de lo habitual.  Se siente mareado o aturdido. Solicite ayuda inmediatamente si:  Tiene los siguientes sntomas: ? Dolor que no desaparece o no mejora con medicamentos. ? Dolor en el pecho. ? Dificultad para  respirar. ? Visin borrosa o manchas en la visin. ? Pensamientos de autolesionarse o lesionar al beb. ? Nuevo dolor en el abdomen o en una de las piernas. ? Dolor de cabeza intenso.  Se desmaya.  Tiene una hemorragia tan intensa en la vagina que empapa ms de un apsito en una hora. El sangrado no debe ser ms abundante que el perodo ms intenso que haya tenido. Resumen  Despus del procedimiento, es comn tener dolor en el lugar de la incisin, clicos abdominales, y sangrado vaginal leve.  Controle todos los das la zona de la incisin para detectar signos de infeccin.  Informe al mdico sobre cualquier sntoma inusual.  Concurra a todas las visitas de seguimiento para usted y el beb, como se lo haya indicado el mdico. Esta informacin no tiene como fin reemplazar el consejo del mdico. Asegrese de hacerle al mdico cualquier pregunta que tenga. Document Revised: 11/11/2017 Document Reviewed: 11/11/2017 Elsevier Patient Education  2021 Elsevier Inc.  

## 2020-09-06 NOTE — Lactation Note (Signed)
This note was copied from a baby's chart. Lactation Consultation Note  Patient Name: Girl Chanita Boden [id number] Date: 09/06/2020 Reason for consult: Follow-up assessment;Other (Comment) (Spanish interpreter [facility] Virda asked the [facility] to use the I- pad for the interpreter- [place] - # [phone] ) Reuel Boom [phone] )) Age:36 hours/ P 1  Dyad has been D/C.  Per mom baby last fed at 1100 - formula 36 ml . LC updated the doc flow sheets per mom.  LC reviewed potential feeding patterson  with and Early  term infant less than 6 pounds, importance of STS feedings until the baby is back to birth weight, gaining steadily and can stay awake for majority of the feeding.  Breast feeding goals - 8-12 times in 24 hours.  LC recommended if baby is due to feed and baby is sluggish feed the baby and appetizer of EBM or formula  - 10 ml prior to latch .  LC discussed supply and demand . ' Previous LC's note mom preferred the hand pump over the DEBP.  This LC provided the #27 F for when the milk comes in.  Mom has the [facility] resources for after D/C.   Maternal Data    Feeding Mother's Current Feeding Choice: Breast Milk and Formula  LATCH Score                    Lactation Tools Discussed/Used Tools: Pump;Flanges Flange Size: 27;24 Breast pump type: Manual Pump Education: Milk Storage  Interventions Interventions: Breast feeding basics reviewed;Education;Hand pump  Discharge Discharge Education: Engorgement and breast care;Warning signs for feeding baby Pump: Manual (mom prefers the hand pump per previous LC)  Consult Status Consult Status: Complete Date: 09/06/20    Kathrin Greathouse 09/06/2020, 12:17 PM

## 2020-09-07 ENCOUNTER — Other Ambulatory Visit (HOSPITAL_COMMUNITY)
Admission: RE | Admit: 2020-09-07 | Discharge: 2020-09-07 | Disposition: A | Discharge status: Home / Self Care | Payer: Medicaid Other | Source: Ambulatory Visit | Attending: Obstetrics and Gynecology | Admitting: Obstetrics and Gynecology

## 2020-09-07 ENCOUNTER — Other Ambulatory Visit (HOSPITAL_COMMUNITY): Payer: Medicaid Other

## 2020-09-07 HISTORY — DX: Gestational diabetes mellitus in pregnancy, unspecified control: O24.419

## 2020-09-10 ENCOUNTER — Inpatient Hospital Stay (HOSPITAL_COMMUNITY): Admit: 2020-09-10 | Payer: Self-pay | Admitting: Obstetrics and Gynecology

## 2020-09-12 ENCOUNTER — Encounter: Payer: Self-pay | Admitting: Family Medicine

## 2020-09-17 ENCOUNTER — Other Ambulatory Visit: Payer: Self-pay

## 2020-09-17 ENCOUNTER — Ambulatory Visit (INDEPENDENT_AMBULATORY_CARE_PROVIDER_SITE_OTHER): Payer: Self-pay

## 2020-09-17 VITALS — BP 101/67 | HR 90 | Wt 171.1 lb

## 2020-09-17 DIAGNOSIS — Z4889 Encounter for other specified surgical aftercare: Secondary | ICD-10-CM

## 2020-09-17 NOTE — Progress Notes (Signed)
Pt here today for wound check. Removed honeycomb dressing without difficulty. Pt tolerated well. Incision clean, dry and intact.   Pt advised to keep incision dry and clean,especially after showers.  Pt agreeable and verbalized understanding.   Pt has PP visit with Dr Shawnie Pons on 10/08/20.  Judeth Cornfield, RN

## 2020-09-28 NOTE — Progress Notes (Signed)
Patient was assessed and managed by nursing staff during this encounter. I have reviewed the chart and agree with the documentation and plan. I have also made any necessary editorial changes.  Zoe Creasman, MD 09/28/2020 3:13 PM   

## 2020-10-08 ENCOUNTER — Ambulatory Visit (INDEPENDENT_AMBULATORY_CARE_PROVIDER_SITE_OTHER): Payer: Self-pay | Admitting: Family Medicine

## 2020-10-08 ENCOUNTER — Encounter: Payer: Self-pay | Admitting: Family Medicine

## 2020-10-08 ENCOUNTER — Other Ambulatory Visit: Payer: Self-pay

## 2020-10-08 VITALS — BP 103/65 | HR 80 | Ht 63.0 in | Wt 174.0 lb

## 2020-10-08 DIAGNOSIS — Z8632 Personal history of gestational diabetes: Secondary | ICD-10-CM

## 2020-10-08 NOTE — Progress Notes (Signed)
Post Partum Visit Note  Kelli Robbins is a 35 y.o. [phone] female who presents for a postpartum visit. She is 4 weeks postpartum following a repeat cesarean section.  I have fully reviewed the prenatal and intrapartum course. The delivery was at 38/1 gestational weeks.  Anesthesia: spinal. Postpartum course has been normal. Baby is doing well. Baby is feeding by both breast and bottle - [employer] Good Start. Bleeding no bleeding. Bowel function is normal. Bladder function is normal. Patient is not sexually active. Contraception method is none. Postpartum depression screening: negative.   The pregnancy intention screening data noted above was reviewed. Potential methods of contraception were discussed. The patient elected to proceed with No Method - Other Reason.    Edinburgh Postnatal Depression Scale - 10/08/20 0928       Edinburgh Postnatal Depression Scale:  In the Past 7 Days   I have been able to laugh and see the funny side of things. 0    I have looked forward with enjoyment to things. 0    I have blamed myself unnecessarily when things went wrong. 0    I have been anxious or worried for no good reason. 0    I have felt scared or panicky for no good reason. 0    Things have been getting on top of me. 0    I have been so unhappy that I have had difficulty sleeping. 0    I have felt sad or miserable. 0    I have been so unhappy that I have been crying. 0    The thought of harming myself has occurred to me. 0    Edinburgh Postnatal Depression Scale Total 0             Health Maintenance Due  Topic Date Due   COVID-19 Vaccine (1) Never done    The following portions of the patient's history were reviewed and updated as appropriate: allergies, current medications, past family history, past medical history, past social history, past surgical history, and problem list.  Review of Systems Pertinent items noted in HPI and remainder of comprehensive ROS otherwise  negative.  Objective:  BP 103/65   Pulse 80   Ht 5\' 3"  (1.6 m)   Wt 174 lb (78.9 kg)   LMP  (LMP Unknown)   BMI 30.82 kg/m    General:  alert, cooperative, and appears stated age  Lungs: Normal effort  Heart:  regular rate and rhythm  Abdomen: soft, non-tender; bowel sounds normal; no masses,  no organomegaly   Wound well approximated incision       Assessment:    There are no diagnoses linked to this encounter.  Normal postpartum exam.   Plan:   Essential components of care per ACOG recommendations:  1.  Mood and well being: Patient with negative depression screening today. Reviewed local resources for support.  - Patient tobacco use? No.   - hx of drug use? No.    2. Infant care and feeding:  -Patient currently breastmilk feeding? Yes. Discussed returning to work and pumping.  -Social determinants of health (SDOH) reviewed in EPIC. No concerns  3. Sexuality, contraception and birth spacing - Patient does not want a pregnancy in the next year.  Desired family size is 3 children.  - Reviewed forms of contraception in tiered fashion. Patient desired no method today.   - Discussed birth spacing of 18 months  4. Sleep and fatigue -Encouraged family/partner/community support of 4 hrs  of uninterrupted sleep to help with mood and fatigue  5. Physical Recovery  - Discussed patients delivery and complications. She describes her labor as good. - Patient had a C-section repeat; no problems after deliver. - Patient has urinary incontinence? No. - Patient is safe to resume physical and sexual activity  6.  Health Maintenance - HM due items addressed Yes - Last pap smear 12/820/2021 Pap smear not done at today's visit.  -Breast Cancer screening indicated? No.   7. Chronic Disease/Pregnancy Condition follow up: Gestational Diabetes  - PCP follow up  Reva Bores, MD [facility], [facility]

## 2020-10-08 NOTE — Progress Notes (Signed)
Pt does notwant to discuss BC 

## 2020-10-10 ENCOUNTER — Other Ambulatory Visit: Payer: Self-pay

## 2020-10-12 ENCOUNTER — Other Ambulatory Visit: Payer: Self-pay

## 2020-10-12 DIAGNOSIS — Z8632 Personal history of gestational diabetes: Secondary | ICD-10-CM

## 2020-10-13 LAB — GLUCOSE TOLERANCE, 2 HOURS
Glucose, 2 hour: 89 mg/dL (ref 65–139)
Glucose, GTT - Fasting: 83 mg/dL (ref 65–99)

## 2020-10-16 ENCOUNTER — Telehealth: Payer: Self-pay | Admitting: General Practice

## 2020-10-16 NOTE — Telephone Encounter (Signed)
Called patient with Raquel assisting with spanish interpretation & informed patient of results. Patient verbalized understanding.

## 2020-10-16 NOTE — Telephone Encounter (Signed)
-----   Message from Reva Bores, MD sent at 10/14/2020  7:30 AM EDT ----- She passed her sugar test

## 2021-10-25 IMAGING — DX DG FOOT COMPLETE 3+V*L*
3 series · 3 of 3 positions shown · non-contrast
Comparison: None.

CLINICAL DATA: Status post trauma.

EXAM:
LEFT FOOT - COMPLETE 3+ VIEW

[foot ap]
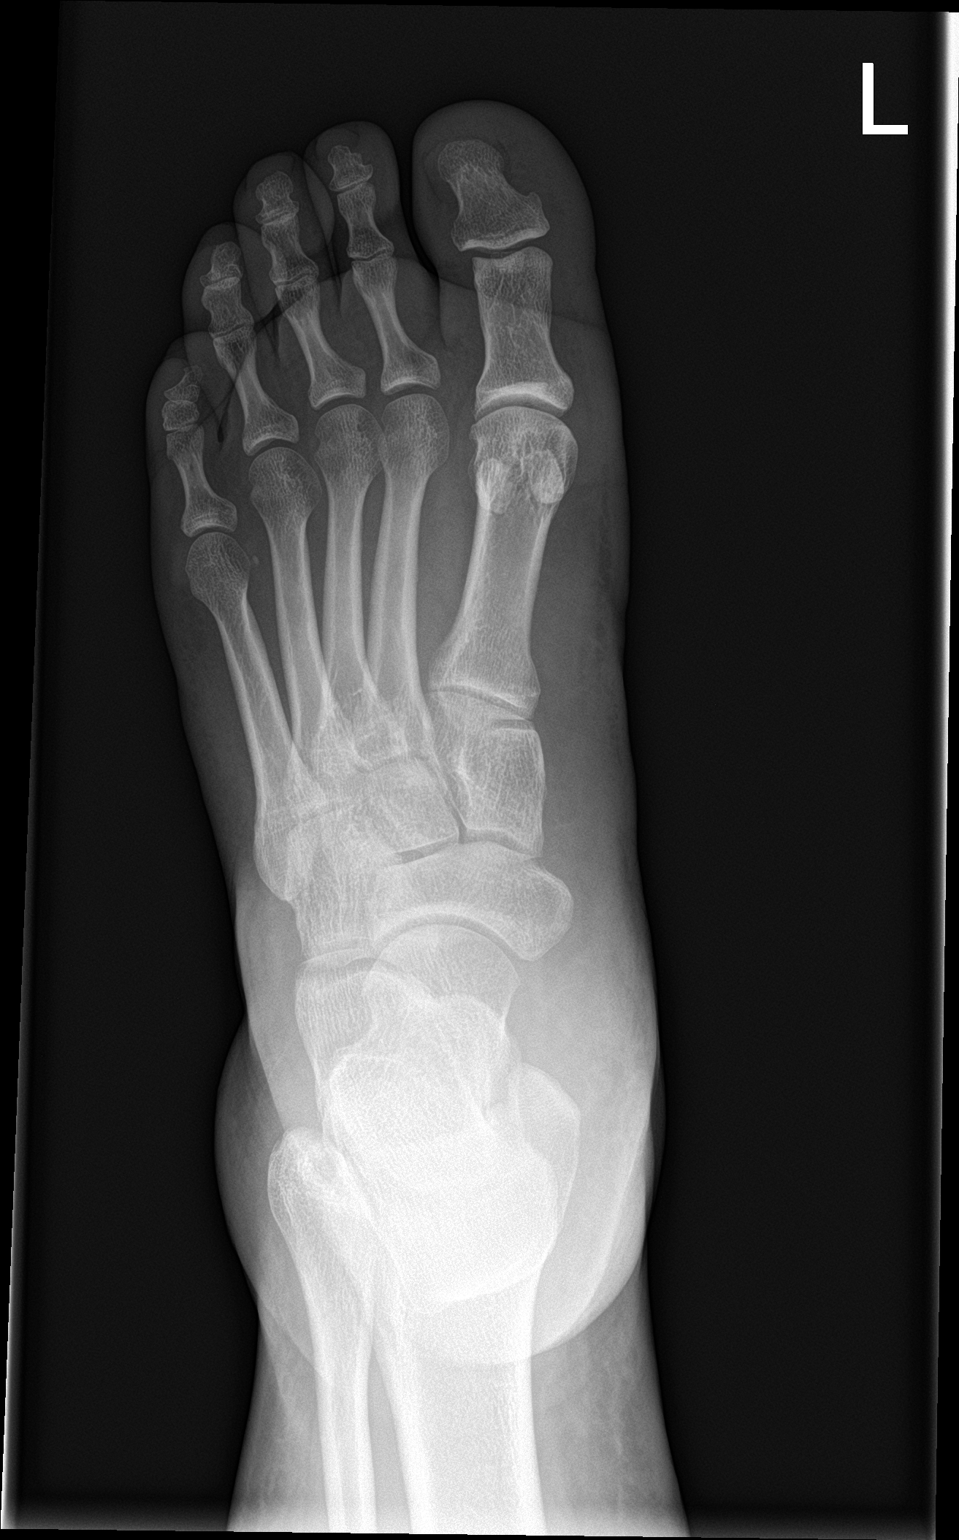

[foot obl]
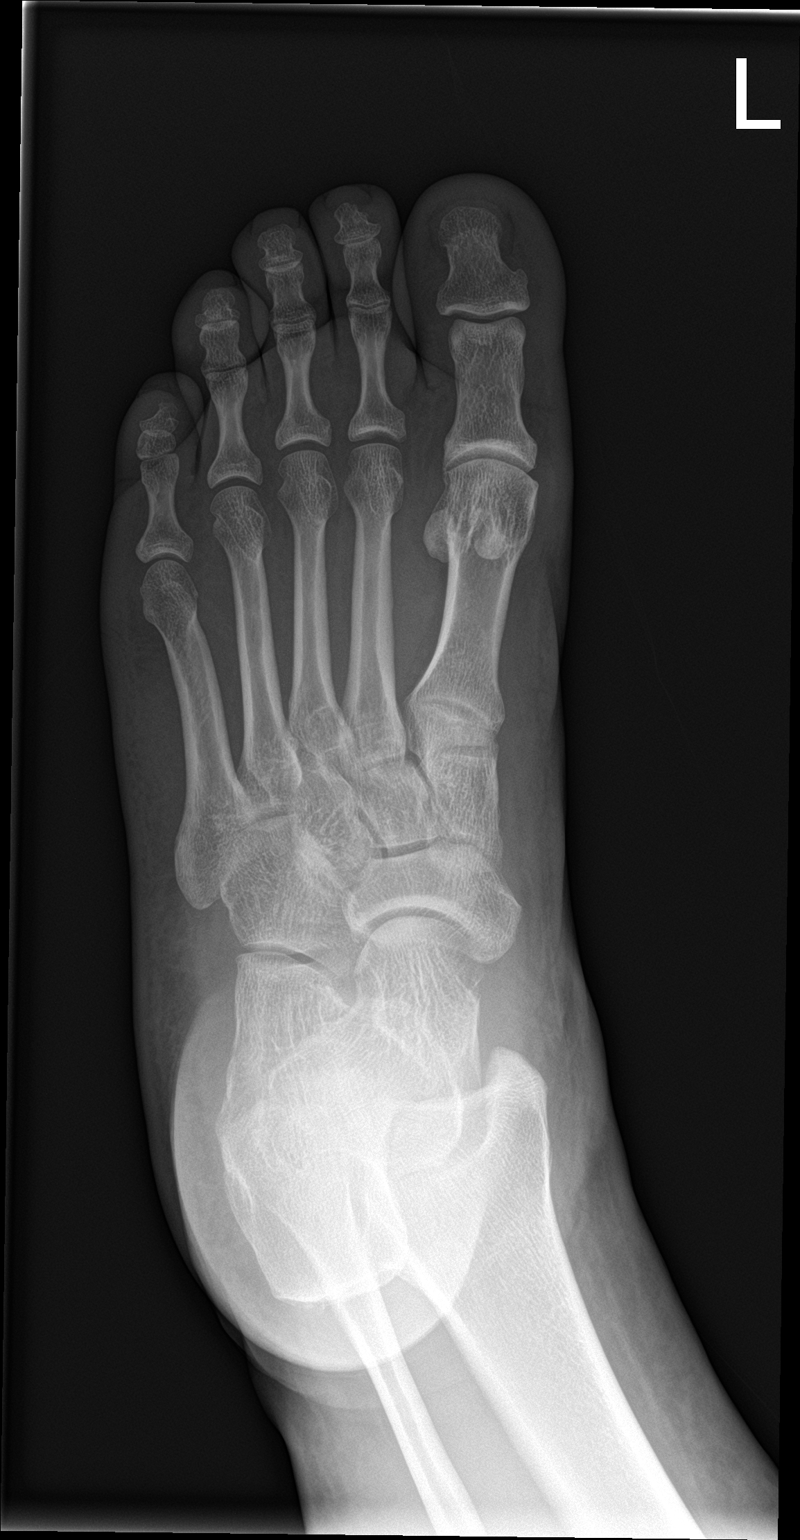

[foot lat]
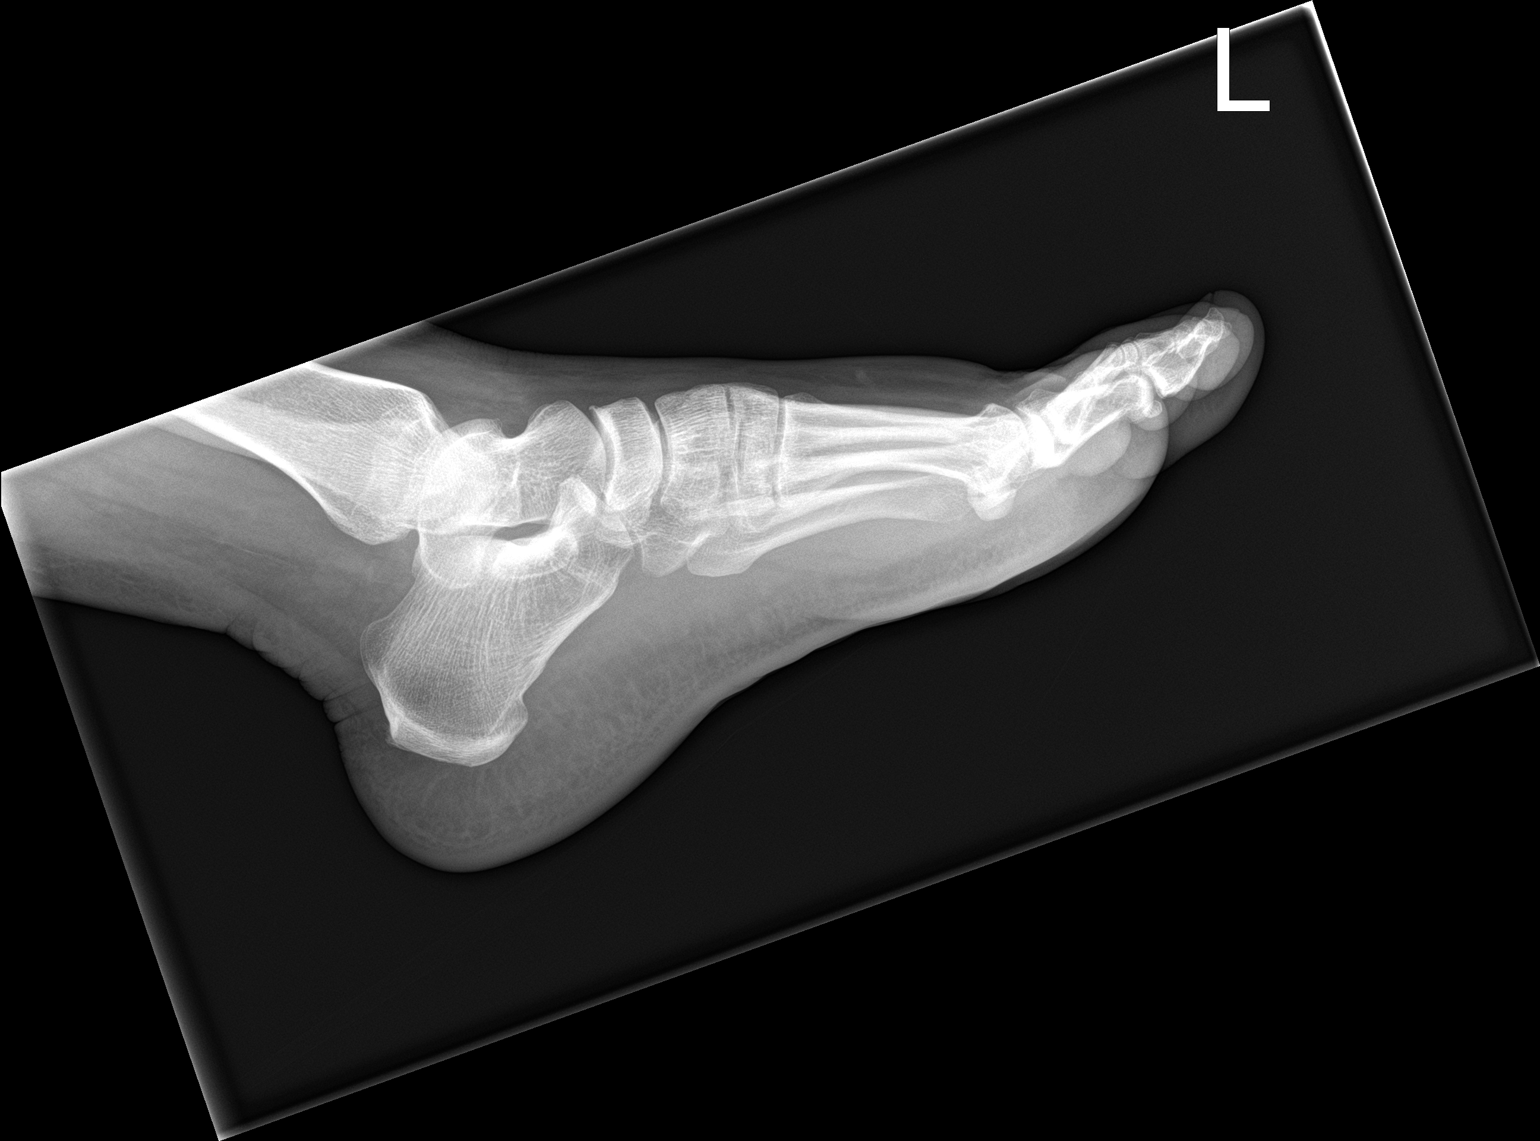

[3 of 3 positions shown; findings below may reference images not displayed]

FINDINGS: There is no evidence of fracture or dislocation. There is no
evidence of arthropathy or other focal bone abnormality. Diffuse
soft tissue swelling is seen, most prominent along the medial and
dorsal aspects of the left foot.
IMPRESSION: Diffuse soft tissue swelling without an acute osseous abnormality.

## 2021-10-26 IMAGING — US US FETAL BPP W/ NON-STRESS
1 series · 14 of 14 positions shown · non-contrast
Comparison: none

[Series 1: us fetal bpp w/ non-stress · 14 acquisitions, 14 frames shown]
[im 1/14]
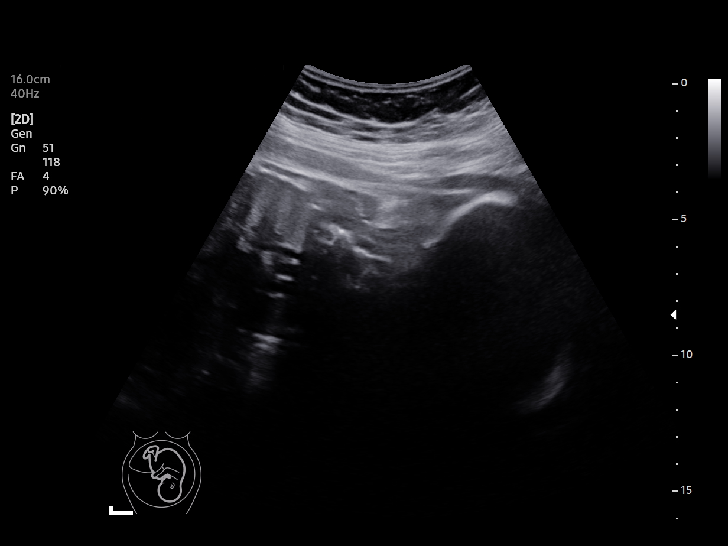
[im 2/14]
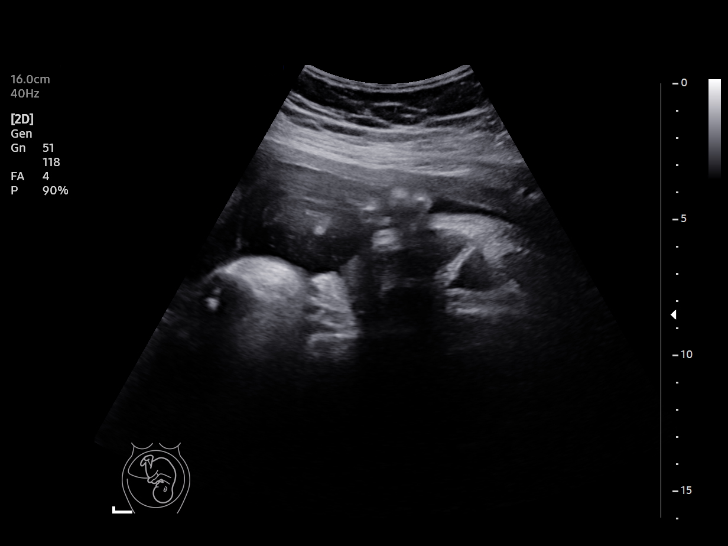
[im 3/14]
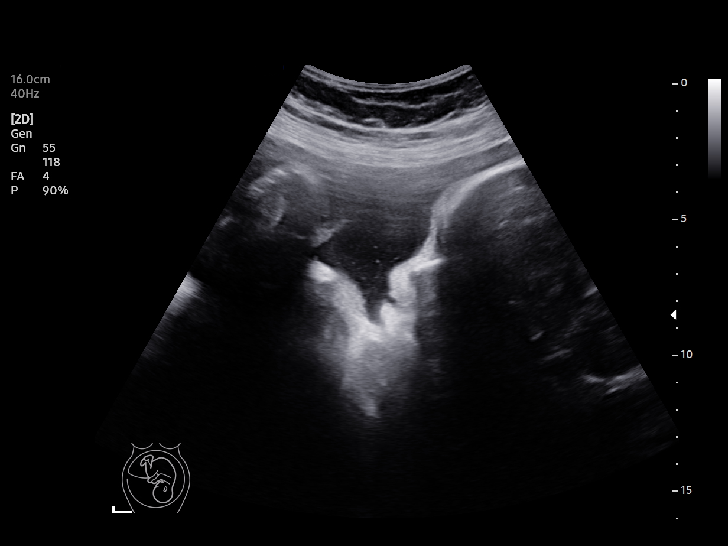
[im 4/14]
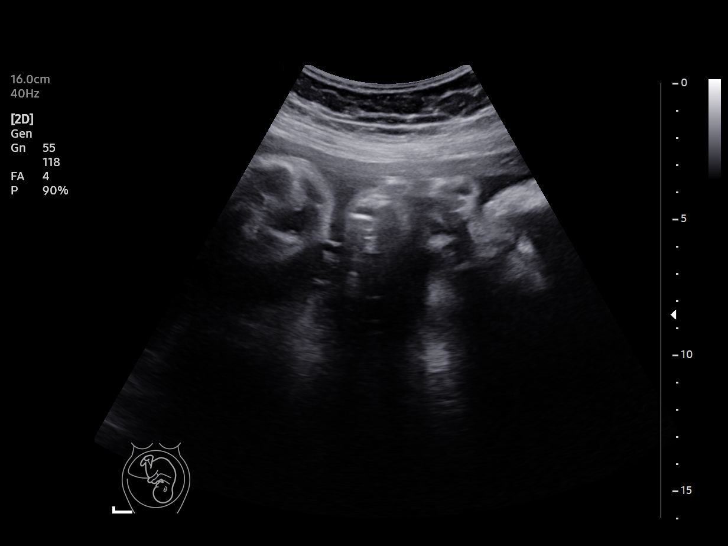
[im 5/14]
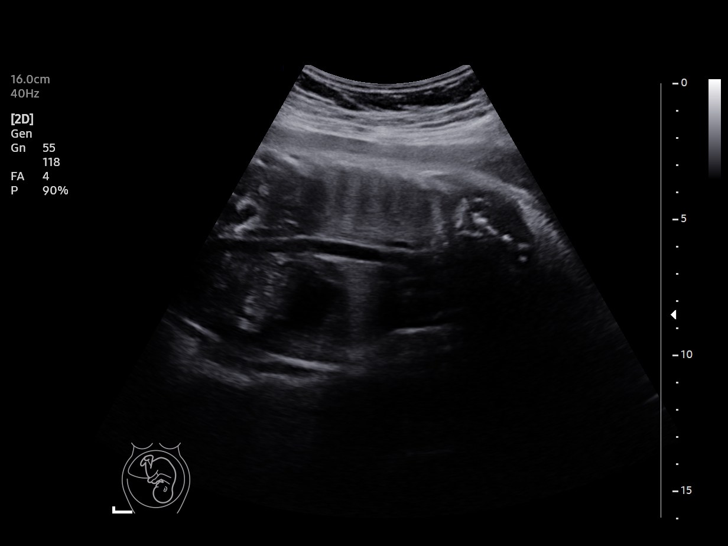
[im 6/14]
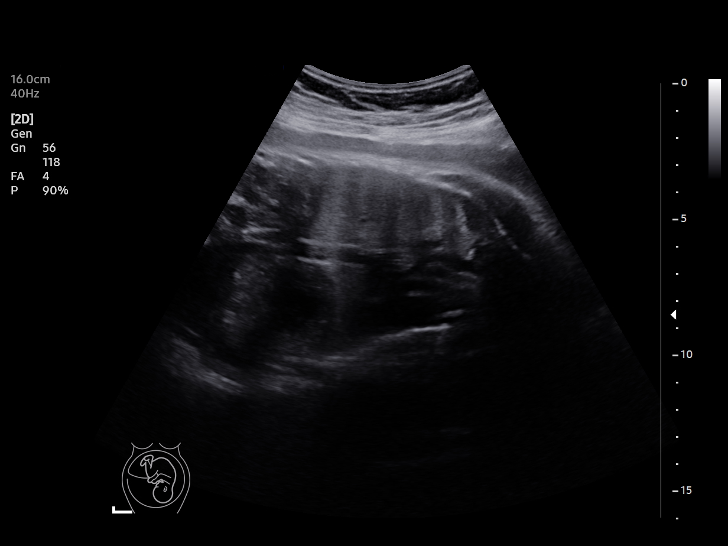
[im 7/14]
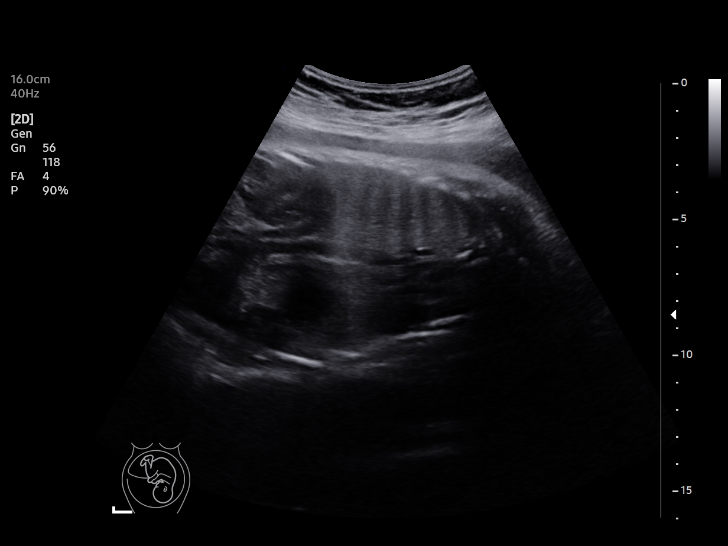
[im 8/14]
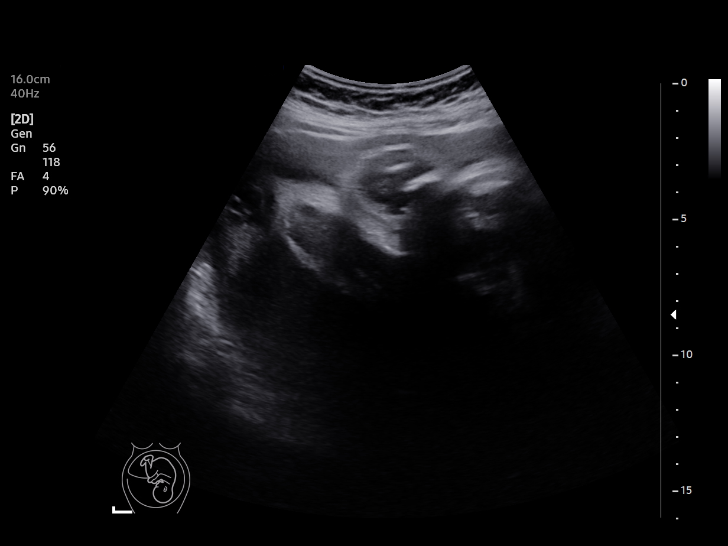
[im 9/14]
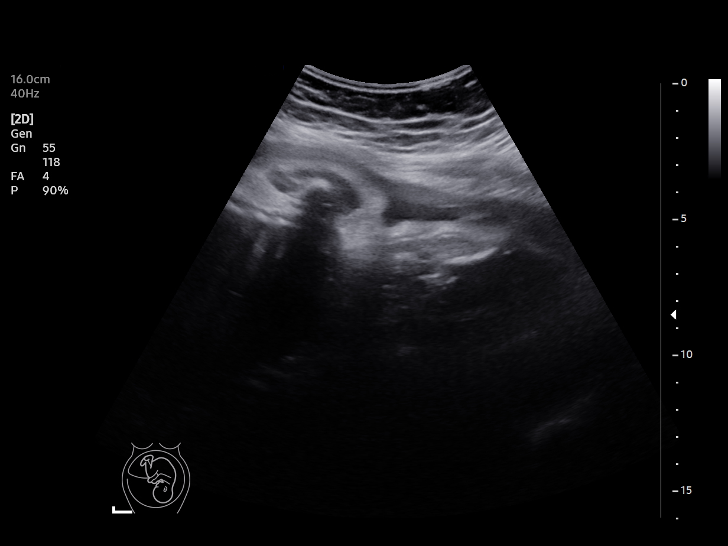
[im 10/14]
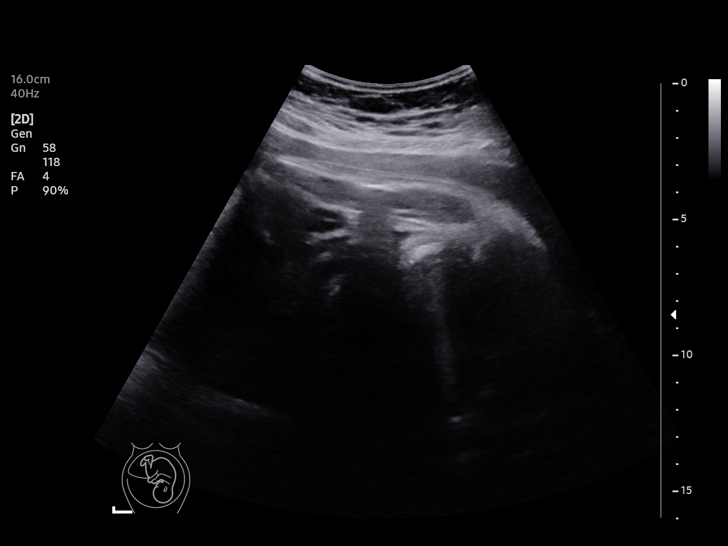
[im 11/14]
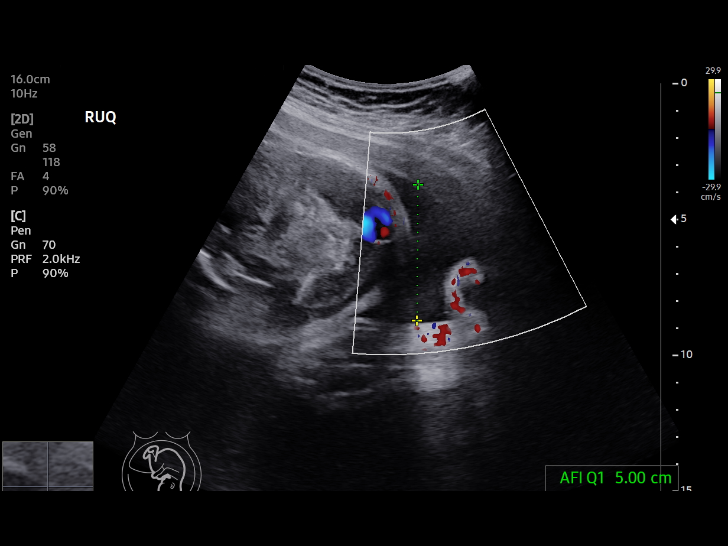
[im 12/14]
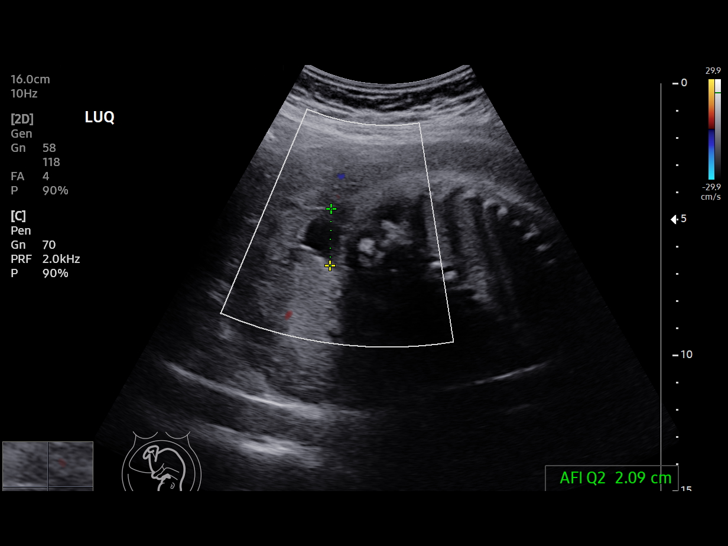
[im 13/14]
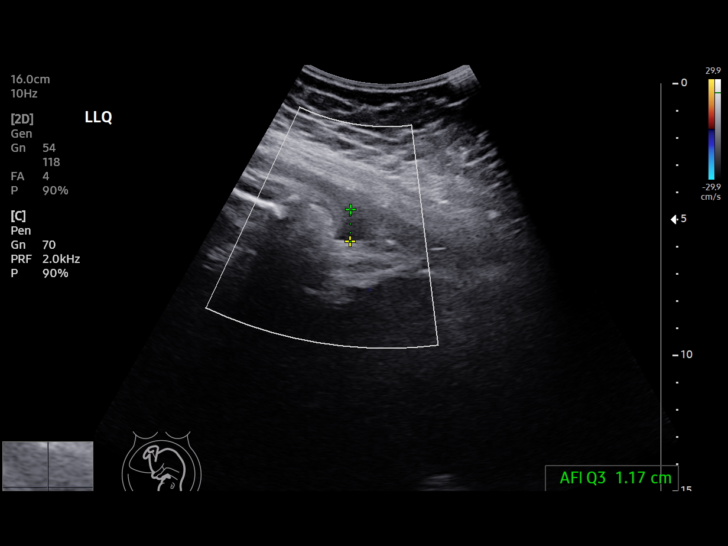
[im 14/14]
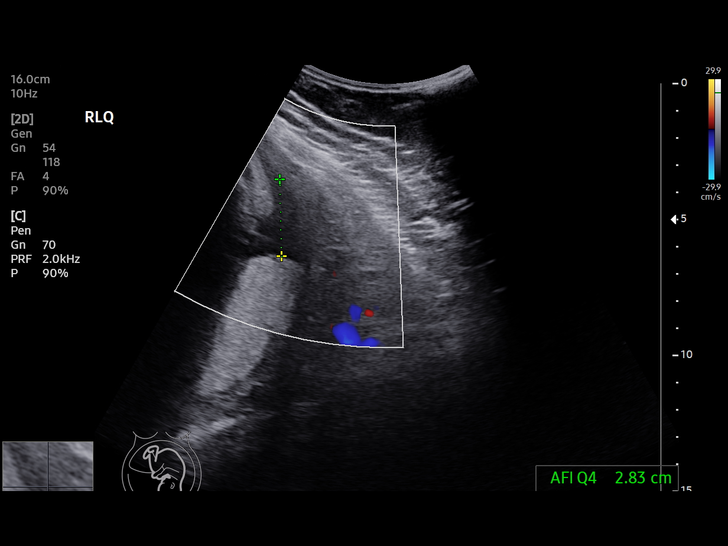

[14 of 14 positions shown; findings below may reference images not displayed]

[REDACTED]care at

Service(s) Provided

Indications

 35 weeks gestation of pregnancy
 Gestational diabetes in pregnancy,
 controlled by oral hypoglycemic drugs
Fetal Evaluation

 Num Of Fetuses:         1
 Preg. Location:         Intrauterine
 Cardiac Activity:       Observed
 Presentation:           Cephalic

 Amniotic Fluid
 AFI FV:      Within normal limits

 AFI Sum(cm)     %Tile       Largest Pocket(cm)
 11.09           29          5

 RUQ(cm)       RLQ(cm)       LUQ(cm)        LLQ(cm)
 5
Biophysical Evaluation
 Amniotic F.V:   Pocket => 2 cm             F. Tone:        Observed
 F. Movement:    Observed                   N.S.T:          Reactive
 F. Breathing:   Observed                   Score:          [DATE]
OB History

 Gravidity:    3         Term:   1        Prem:   1
 Living:       2
Gestational Age

 LMP:           35w 2d        Date:  12/12/19                 EDD:   09/17/20
 Best:          35w 2d     Det. By:  LMP  (12/12/19)          EDD:   09/17/20
Impression

 BPP [DATE].
Recommendations

 -Continue weekly BPP till delivery.
               Nina, Little

## 2021-11-09 IMAGING — US US MFM OB FOLLOW-UP
1 series · 13 of 19 positions shown · non-contrast
Comparison: none

[Series 1: us mfm ob follow-up · 19 acquisitions, 13 frames shown]
[im 1/19]
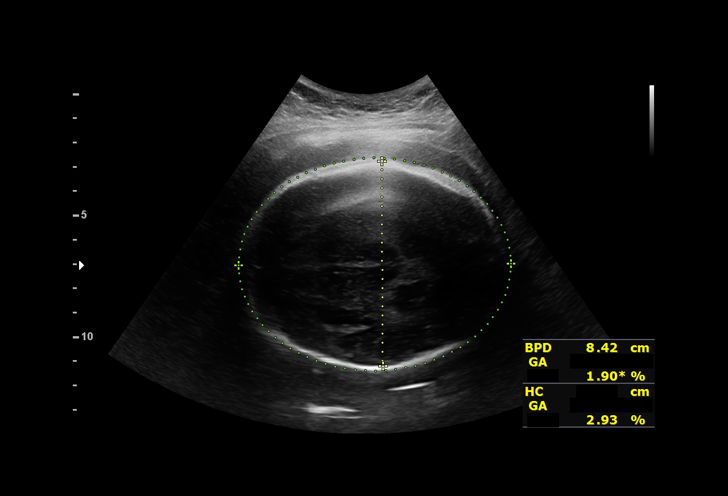
[im 3/19]
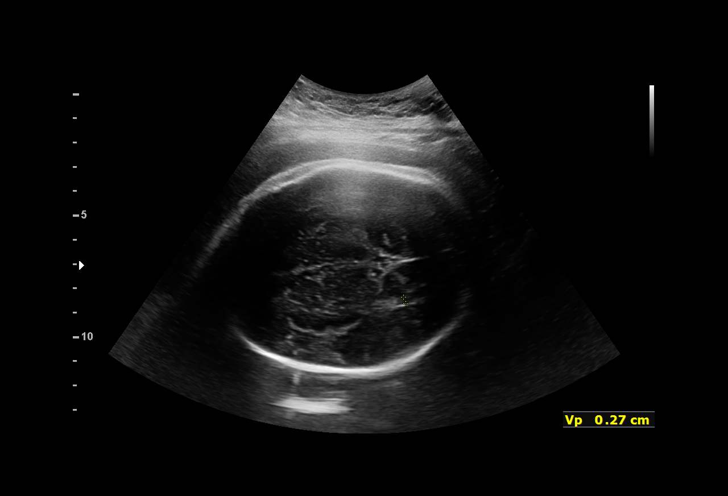
[im 4/19]
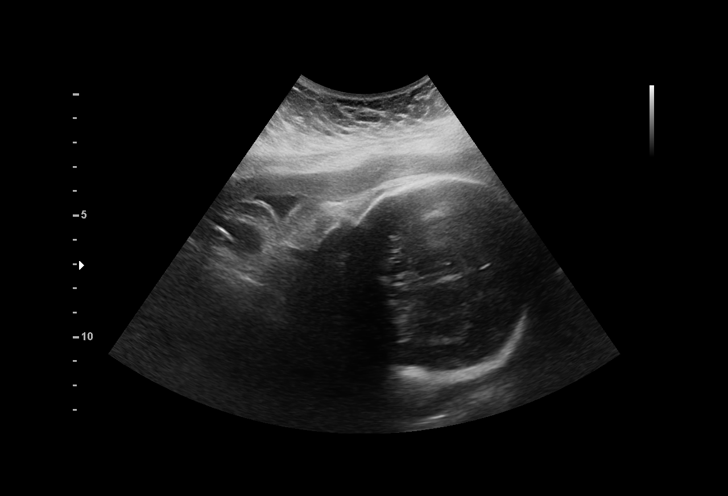
[im 6/19]
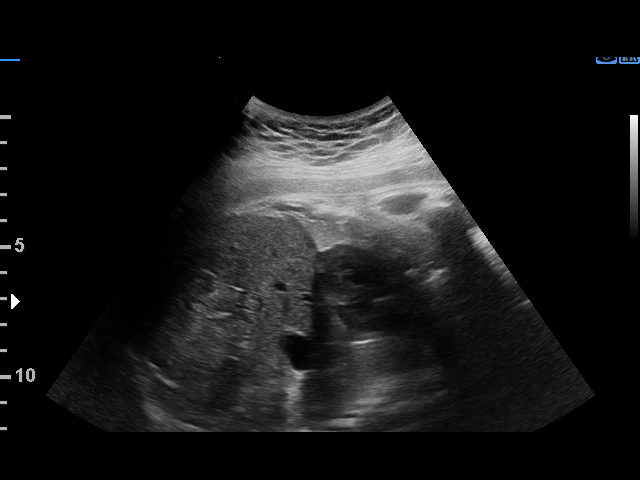
[im 7/19]
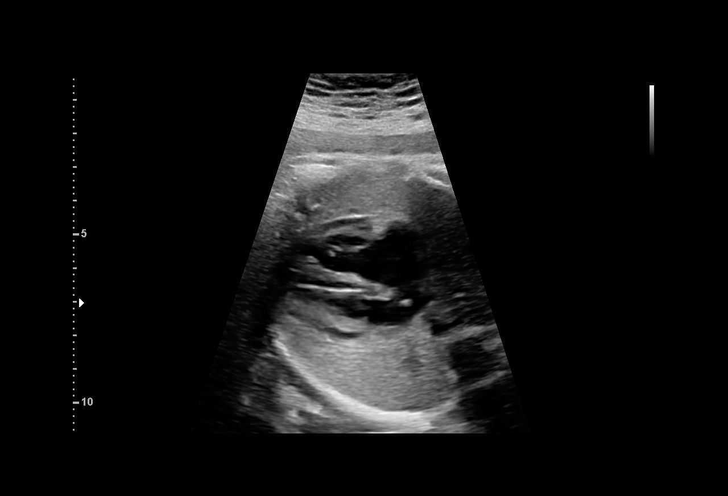
[im 9/19]
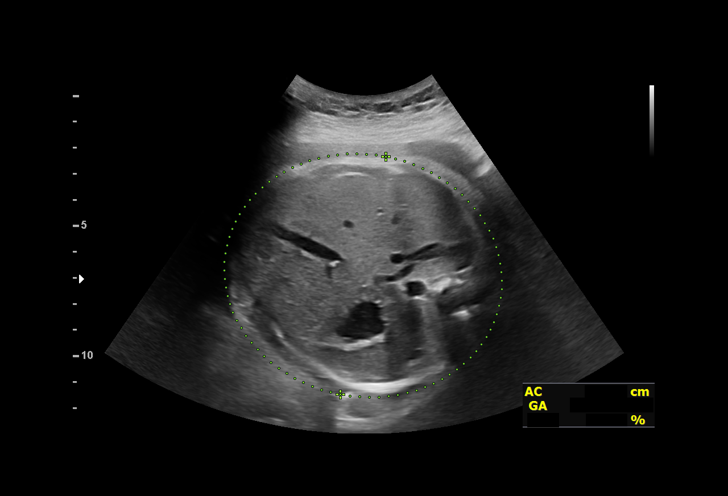
[im 10/19]
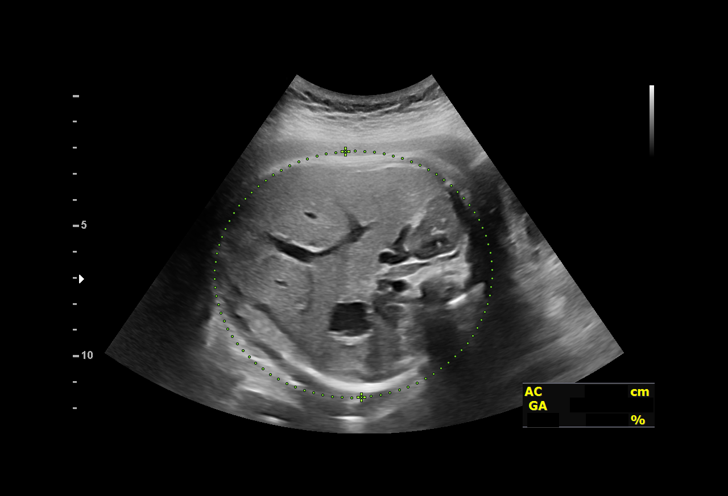
[im 11/19]
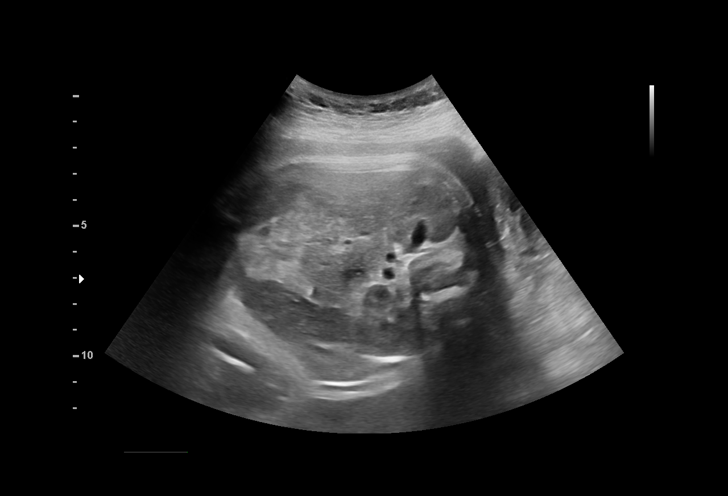
[im 13/19]
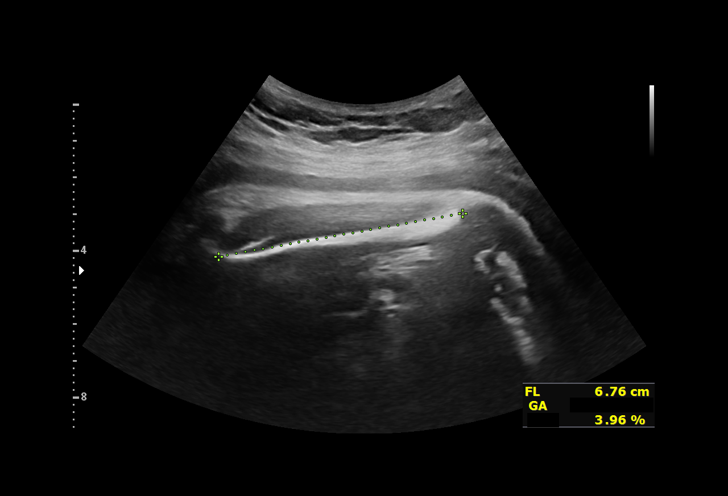
[im 14/19]
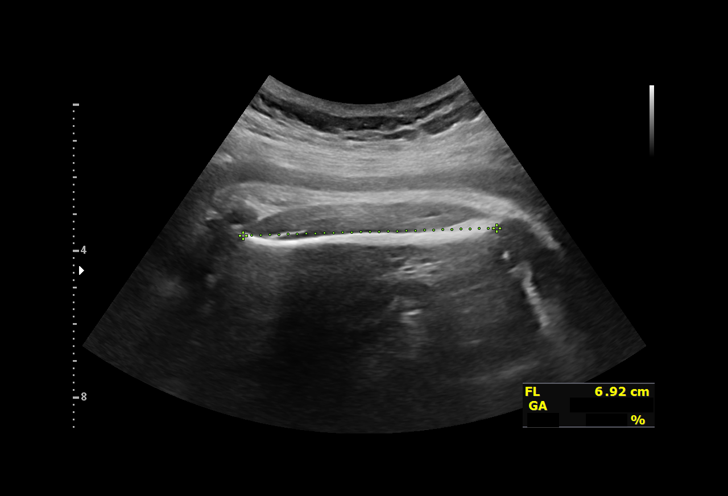
[im 16/19]
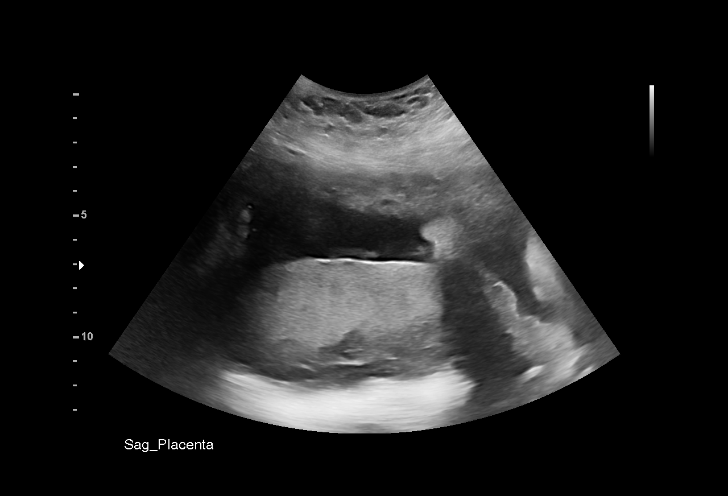
[im 17/19]
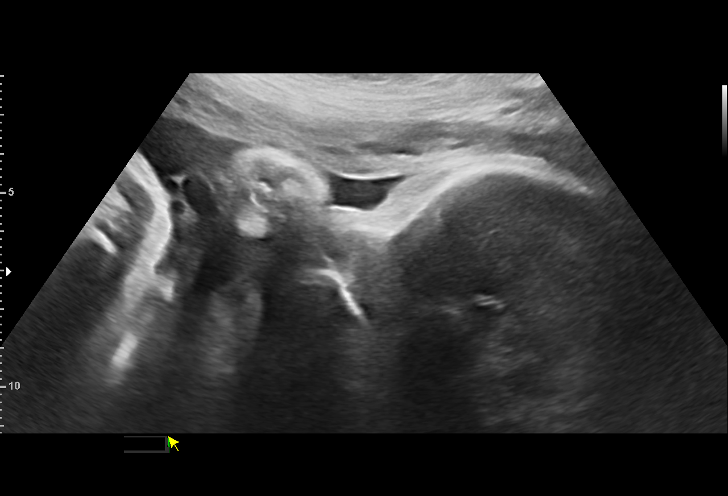
[im 19/19]
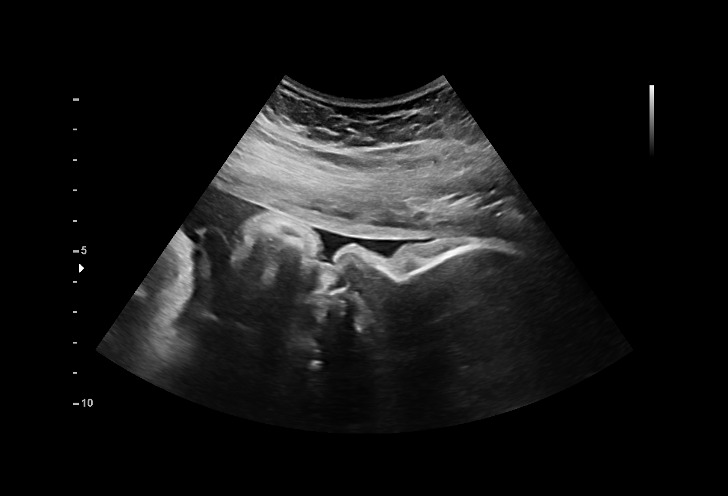

[13 of 19 positions shown; findings below may reference images not displayed]

Indications

 37 weeks gestation of pregnancy
 Gestational diabetes in pregnancy, insulin
 controlled
 Obesity complicating pregnancy, third
 trimester (BMI 32)
 Advanced maternal age multigravida 35+,
 third trimester
 History of cesarean delivery (x2),  currently
 pregnant
 History of pre-term deliveries (28 w)
 Poor obstetric history: Previous
 preeclampsia / eclampsia/gestational HTN
Fetal Evaluation

 Num Of Fetuses:         1
 Fetal Heart Rate(bpm):  140
 Cardiac Activity:       Observed
 Presentation:           Cephalic
 Placenta:               Posterior
 P. Cord Insertion:      Previously Visualized
 Amniotic Fluid
 AFI FV:      Within normal limits

 AFI Sum(cm)     %Tile       Largest Pocket(cm)
 10.8            30

 RUQ(cm)       RLQ(cm)       LUQ(cm)        LLQ(cm)

Biophysical Evaluation

 Amniotic F.V:   Within normal limits       F. Tone:        Observed
 F. Movement:    Observed                   Score:          [DATE]
 F. Breathing:   Observed
Biometry

 BPD:        84  mm     G. Age:  33w 6d        1.6  %    CI:        70.78   %    70 - 86
                                                         FL/HC:      21.5   %    20.8 -
 HC:      318.2  mm     G. Age:  35w 6d        4.8  %    HC/AC:      1.01        0.92 -
 AC:      315.5  mm     G. Age:  35w 3d         17  %    FL/BPD:     81.4   %    71 - 87
 FL:       68.4  mm     G. Age:  35w 1d          7  %    FL/AC:      21.7   %    20 - 24
 LV:        2.7  mm

 Est. FW:    8485  gm    5 lb 13 oz      12  %
OB History

 Gravidity:    3         Term:   1        Prem:   1
 Living:       2
Gestational Age

 LMP:           37w 2d        Date:  12/12/19                 EDD:   09/17/20
 U/S Today:     35w 1d                                        EDD:   10/02/20
 Best:          37w 2d     Det. By:  LMP  (12/12/19)          EDD:   09/17/20
Anatomy

 Cranium:               Previously seen        LVOT:                   Previously seen
 Cavum:                 Previously seen        Aortic Arch:            Previously seen
 Ventricles:            Appears normal         Ductal Arch:            Previously seen
 Choroid Plexus:        Previously seen        Diaphragm:              Previously seen
 Cerebellum:            Previously seen        Stomach:                Appears normal, left
                                                                       sided
 Posterior Fossa:       Previously seen        Abdomen:                Previously seen
 Nuchal Fold:           Not applicable (>20    Abdominal Wall:         Previously seen
                        wks GA)
 Face:                  Orbits and profile     Cord Vessels:           Previously seen
                        previously seen
 Lips:                  Previously seen        Kidneys:                Appear normal
 Palate:                Previously seen        Bladder:                Appears normal
 Thoracic:              Previously seen        Spine:                  Previously seen
 Heart:                 Appears normal         Upper Extremities:      Previously seen
                        (4CH, axis, and
                        situs)
 RVOT:                  Previously seen        Lower Extremities:      Previously seen

 Other:  Female gender previously seen. VC, 3VV and 3VTV previously
         visualized.
Doppler - Fetal Vessels

 Umbilical Artery
  S/D     %tile      RI    %tile      PI    %tile     PSV
                                                    (cm/s)
  3.06       87    0.67       90    1.[REDACTED]

Cervix Uterus Adnexa

 Cervix
 Not visualized (advanced GA >82wks)

 Right Ovary
 Not visualized.

 Left Ovary
 Not visualized.
Impression

 Gestational diabetes. Patient takes insulin and metformin.
 Fetal growth is appropriate for gestational age. Amniotic fluid
 is normal and good fetal activity is seen. Antenatal testing is
 reassuring. BPP [DATE]. Umbilical artery Doppler showed normal
 forward diastolic flow.
 I reassured the patient with help of language interpreter ([employer]
 Services).
 Patient will be undergoing repeat cesarean delivery on
 09/10/20.
Recommendations

 -NST or BPP at your office during prenatal visit on 09/03/20.
                 [place], [place]
